# Patient Record
Sex: Female | Born: 1985 | Race: Black or African American | Hispanic: No | Marital: Single | Smoking: Never smoker
Health system: Southern US, Community
[De-identification: ages and names within clinical notes are randomized; demographics above are authoritative.]

## PROBLEM LIST (undated history)

## (undated) DIAGNOSIS — K219 Gastro-esophageal reflux disease without esophagitis: Secondary | ICD-10-CM

## (undated) DIAGNOSIS — I10 Essential (primary) hypertension: Secondary | ICD-10-CM

## (undated) DIAGNOSIS — T4145XA Adverse effect of unspecified anesthetic, initial encounter: Secondary | ICD-10-CM

## (undated) DIAGNOSIS — T8859XA Other complications of anesthesia, initial encounter: Secondary | ICD-10-CM

## (undated) HISTORY — DX: Essential (primary) hypertension: I10

---

## 2014-06-29 DIAGNOSIS — E663 Overweight: Secondary | ICD-10-CM | POA: Insufficient documentation

## 2014-09-21 DIAGNOSIS — K649 Unspecified hemorrhoids: Secondary | ICD-10-CM | POA: Insufficient documentation

## 2018-04-29 ENCOUNTER — Ambulatory Visit: Payer: Self-pay | Admitting: Surgery

## 2018-04-29 NOTE — H&P (Signed)
Subjective:  CC: Anal skin tag [K64.4]   HPI:  Karen Gaines is a 33 y.o. female who was referrred by Mayford Knife for above. Symptoms were first noted several years ago.  Pain is dull and intermittent, confined to the perianal area, without radiation.  Associated with nothing specific like bleeding, exacerbated by BMs.    Intermittent enlargements and bleeding but maintaining good hygiene is main concern.  Toilet habits: Daily, soft BMs, with minimal straining   Patient denies a personal history of colon cancer. Patient denies a personal history of IBD. Patient denies to history of polyps.  Never had colonoscopy   Past Medical History:  has a past medical history of GERD (gastroesophageal reflux disease).  Past Surgical History:  has a past surgical history that includes Cesarean section (10/03/2016).  Family History: reviewed and not relevant to CC  Social History:  reports that she has never smoked. She has never used smokeless tobacco. She reports previous alcohol use. She reports that she does not use drugs.  Current Medications: has a current medication list which includes the following prescription(s): acetaminophen and norethindrone.  Allergies:     Allergies as of 04/25/2018  . (No Known Allergies)    ROS:  A 15 point review of systems was performed and pertinent positives and negatives noted in HPI  Objective:   BP 121/76 (BP Location: Left upper arm, Patient Position: Sitting, BP Cuff Size: Adult)   Ht 172.7 cm (5\' 8" )   Wt 78 kg (172 lb)   LMP 04/09/2018 (Exact Date)   BMI 26.15 kg/m   Constitutional :  alert, appears stated age, cooperative and no distress  Lymphatics/Throat::  no asymmetry, masses, or scars  Respiratory:  clear to auscultation bilaterally  Cardiovascular:  regular rate and rhythm  Gastrointestinal: soft, non-tender; bowel sounds normal; no masses,  no organomegaly.    Musculoskeletal: Steady gait and movement  Skin: Cool and moist   Psychiatric: Normal affect, non-agitated, not confused  Genital/Rectal: Chaperone present for exam.  External exam noted to have several external skin tags circumfrentially, with one larger one that DRE revealed, normal rectal tone, with palpable internal hemorrhoid noted at RA portion with minimal discomfort.  After obtaining verbal consent, an anoscope was inserted after prepped with lubricant and internal hemorrhoids noted on DRE confirmed visually, with no evidence of thrombosis. No other pathology such as fissures, fistulas, polyps noted.  Scope withdrawn and patient tolerate procedure well.     LABS:   N/a  RADS: n/a Assessment:   Anal skin tag [K64.4]  Internal hemorrhoids grade I Plan:  1. Anal skin tag [K64.4] Discussed risks/benefits/alternatives to surgery.  Alternatives include the options of observation, medical management.  Benefits include symptomatic relief.  I discussed  in detail and the complications related to the operation and the anesthesia, including bleeding, infection, recurrence, remote possibility of temporary or permanent fecal incontinence, poor/delayed wound healing, chronic pain, and additional procedures to address said risks. The risks of general anesthetic, if used, includes MI, CVA, sudden death or even reaction to anesthetic medications also discussed.   We also discussed typical post operative recovery which includes weeks to potentially months of anal pain, drainage, occasional bleeding, and sense of fecal urgency.    ED return precautions given for sudden increase in pain, bleeding, with possible accompanying fever, nausea, and/or vomiting.  The patient understands the risks, any and all questions were answered to the patient's satisfaction.  2. Patient has elected to proceed with surgical removal of skin  tags and internal hemorrhoidectomy, stating she does not want to continue to need to wash the area with water every time she has a BM.  Procedure  will be scheduled.  Written consent was obtained.    Electronically signed by Rodarius Kichline, DO on 04/25/2018 10:04 AM      

## 2018-04-29 NOTE — H&P (View-Only) (Signed)
Subjective:  CC: Anal skin tag [K64.4]   HPI:  Karen Gaines is a 33 y.o. female who was referrred by Mayford Knife for above. Symptoms were first noted several years ago.  Pain is dull and intermittent, confined to the perianal area, without radiation.  Associated with nothing specific like bleeding, exacerbated by BMs.    Intermittent enlargements and bleeding but maintaining good hygiene is main concern.  Toilet habits: Daily, soft BMs, with minimal straining   Patient denies a personal history of colon cancer. Patient denies a personal history of IBD. Patient denies to history of polyps.  Never had colonoscopy   Past Medical History:  has a past medical history of GERD (gastroesophageal reflux disease).  Past Surgical History:  has a past surgical history that includes Cesarean section (10/03/2016).  Family History: reviewed and not relevant to CC  Social History:  reports that she has never smoked. She has never used smokeless tobacco. She reports previous alcohol use. She reports that she does not use drugs.  Current Medications: has a current medication list which includes the following prescription(s): acetaminophen and norethindrone.  Allergies:     Allergies as of 04/25/2018  . (No Known Allergies)    ROS:  A 15 point review of systems was performed and pertinent positives and negatives noted in HPI  Objective:   BP 121/76 (BP Location: Left upper arm, Patient Position: Sitting, BP Cuff Size: Adult)   Ht 172.7 cm (5\' 8" )   Wt 78 kg (172 lb)   LMP 04/09/2018 (Exact Date)   BMI 26.15 kg/m   Constitutional :  alert, appears stated age, cooperative and no distress  Lymphatics/Throat::  no asymmetry, masses, or scars  Respiratory:  clear to auscultation bilaterally  Cardiovascular:  regular rate and rhythm  Gastrointestinal: soft, non-tender; bowel sounds normal; no masses,  no organomegaly.    Musculoskeletal: Steady gait and movement  Skin: Cool and moist   Psychiatric: Normal affect, non-agitated, not confused  Genital/Rectal: Chaperone present for exam.  External exam noted to have several external skin tags circumfrentially, with one larger one that DRE revealed, normal rectal tone, with palpable internal hemorrhoid noted at RA portion with minimal discomfort.  After obtaining verbal consent, an anoscope was inserted after prepped with lubricant and internal hemorrhoids noted on DRE confirmed visually, with no evidence of thrombosis. No other pathology such as fissures, fistulas, polyps noted.  Scope withdrawn and patient tolerate procedure well.     LABS:   N/a  RADS: n/a Assessment:   Anal skin tag [K64.4]  Internal hemorrhoids grade I Plan:  1. Anal skin tag [K64.4] Discussed risks/benefits/alternatives to surgery.  Alternatives include the options of observation, medical management.  Benefits include symptomatic relief.  I discussed  in detail and the complications related to the operation and the anesthesia, including bleeding, infection, recurrence, remote possibility of temporary or permanent fecal incontinence, poor/delayed wound healing, chronic pain, and additional procedures to address said risks. The risks of general anesthetic, if used, includes MI, CVA, sudden death or even reaction to anesthetic medications also discussed.   We also discussed typical post operative recovery which includes weeks to potentially months of anal pain, drainage, occasional bleeding, and sense of fecal urgency.    ED return precautions given for sudden increase in pain, bleeding, with possible accompanying fever, nausea, and/or vomiting.  The patient understands the risks, any and all questions were answered to the patient's satisfaction.  2. Patient has elected to proceed with surgical removal of skin  tags and internal hemorrhoidectomy, stating she does not want to continue to need to wash the area with water every time she has a BM.  Procedure  will be scheduled.  Written consent was obtained.    Electronically signed by Sung Amabile, DO on 04/25/2018 10:04 AM

## 2018-05-01 ENCOUNTER — Other Ambulatory Visit: Payer: Self-pay

## 2018-05-01 ENCOUNTER — Encounter
Admission: RE | Admit: 2018-05-01 | Discharge: 2018-05-01 | Disposition: A | Discharge status: Home / Self Care | Payer: Medicaid Other | Source: Ambulatory Visit | Attending: Surgery | Admitting: Surgery

## 2018-05-01 HISTORY — DX: Adverse effect of unspecified anesthetic, initial encounter: T41.45XA

## 2018-05-01 HISTORY — DX: Other complications of anesthesia, initial encounter: T88.59XA

## 2018-05-01 HISTORY — DX: Gastro-esophageal reflux disease without esophagitis: K21.9

## 2018-05-01 NOTE — Patient Instructions (Signed)
Your procedure is scheduled on: 05-09-18 Report to Same Day Surgery 2nd floor medical mall [facility] Entrance-take elevator on left to 2nd floor.  Check in with surgery information desk.) To find out your arrival time please call [phone] between 1PM - 3PM on 05-08-18  Remember: Instructions that are not followed completely may result in serious medical risk, up to and including death, or upon the discretion of your surgeon and anesthesiologist your surgery may need to be rescheduled.    _x___ 1. Do not eat food after midnight the night before your procedure. You may drink clear liquids up to 2 hours before you are scheduled to arrive at the hospital for your procedure.  Do not drink clear liquids within 2 hours of your scheduled arrival to the hospital.  Clear liquids include  --Water or Apple juice without pulp  --Clear carbohydrate beverage such as ClearFast or Gatorade  --Black Coffee or Clear Tea (No milk, no creamers, do not add anything to the coffee or Tea   ____Ensure clear carbohydrate drink on the way to the hospital for bariatric patients  ____Ensure clear carbohydrate drink 3 hours before surgery for Dr Rutherford Nail patients if physician instructed.   No gum chewing or hard candies.     __x__ 2. No Alcohol for 24 hours before or after surgery.   __x__3. No Smoking or e-cigarettes for 24 prior to surgery.  Do not use any chewable tobacco products for at least 6 hour prior to surgery   ____  4. Bring all medications with you on the day of surgery if instructed.    __x__ 5. Notify your doctor if there is any change in your medical condition     (cold, fever, infections).    x___6. On the morning of surgery brush your teeth with toothpaste and water.  You may rinse your mouth with mouth wash if you wish.  Do not swallow any toothpaste or mouthwash.   Do not wear jewelry, make-up, hairpins, clips or nail polish.  Do not wear lotions, powders, or perfumes. You may wear  deodorant.  Do not shave 48 hours prior to surgery. Men may shave face and neck.  Do not bring valuables to the hospital.    [facility] is not responsible for any belongings or valuables.               Contacts, dentures or bridgework may not be worn into surgery.  Leave your suitcase in the car. After surgery it may be brought to your room.  For patients admitted to the hospital, discharge time is determined by your  treatment team.  _  Patients discharged the day of surgery will not be allowed to drive home.  You will need someone to drive you home and stay with you the night of your procedure.    Please read over the following fact sheets that you were given:   [facility] Preparing for Surgery   ____ Take anti-hypertensive listed below, cardiac, seizure, asthma, anti-reflux and psychiatric medicines. These include:  1. NONE  2.  3.  4.  5.  6.  ____Fleets enema or Magnesium Citrate as directed.   ____ Use CHG Soap or sage wipes as directed on instruction sheet   ____ Use inhalers on the day of surgery and bring to hospital day of surgery  ____ Stop Metformin and Janumet 2 days prior to surgery.    ____ Take 1/2 of usual insulin dose the night before surgery and none  on the morning     surgery.   ____ Follow recommendations from Cardiologist, Pulmonologist or PCP regarding          stopping Aspirin, Coumadin, Plavix ,Eliquis, Effient, or Pradaxa, and Pletal.  X____Stop Anti-inflammatories such as Advil, Aleve, Ibuprofen, Motrin, Naproxen, Naprosyn, Goodies powders or aspirin products NOW-OK to take Tylenol   ____ Stop supplements until after surgery.  But may continue Vitamin D, Vitamin B,       and multivitamin.   ____ Bring C-Pap to the hospital.

## 2018-05-09 ENCOUNTER — Encounter: Payer: Self-pay | Admitting: *Deleted

## 2018-05-09 ENCOUNTER — Ambulatory Visit: Payer: Medicaid Other | Admitting: Registered Nurse

## 2018-05-09 ENCOUNTER — Other Ambulatory Visit: Payer: Self-pay

## 2018-05-09 ENCOUNTER — Encounter
Admission: RE | Disposition: A | Discharge status: Home / Self Care | Payer: Self-pay | Source: Home / Self Care | Attending: Surgery

## 2018-05-09 ENCOUNTER — Ambulatory Visit
Admission: RE | Admit: 2018-05-09 | Discharge: 2018-05-09 | Disposition: A | Discharge status: Home / Self Care | Payer: Medicaid Other | Attending: Surgery | Admitting: Surgery

## 2018-05-09 DIAGNOSIS — K644 Residual hemorrhoidal skin tags: Secondary | ICD-10-CM | POA: Insufficient documentation

## 2018-05-09 DIAGNOSIS — K641 Second degree hemorrhoids: Secondary | ICD-10-CM

## 2018-05-09 DIAGNOSIS — L918 Other hypertrophic disorders of the skin: Secondary | ICD-10-CM | POA: Insufficient documentation

## 2018-05-09 DIAGNOSIS — K219 Gastro-esophageal reflux disease without esophagitis: Secondary | ICD-10-CM | POA: Insufficient documentation

## 2018-05-09 DIAGNOSIS — Z79899 Other long term (current) drug therapy: Secondary | ICD-10-CM | POA: Insufficient documentation

## 2018-05-09 HISTORY — PX: HEMORRHOID SURGERY: SHX153

## 2018-05-09 LAB — POCT PREGNANCY, URINE: Preg Test, Ur: NEGATIVE

## 2018-05-09 SURGERY — HEMORRHOIDECTOMY
Anesthesia: General | Site: Rectum

## 2018-05-09 MED ORDER — GABAPENTIN 300 MG PO CAPS
300.0000 mg | ORAL_CAPSULE | ORAL | Status: AC
  Administered 2018-05-09: 300 mg via ORAL

## 2018-05-09 MED ORDER — ACETAMINOPHEN 325 MG PO TABS: 650.0000 mg | ORAL_TABLET | Freq: Three times a day (TID) | ORAL | 0 refills | Status: AC | PRN

## 2018-05-09 MED ORDER — MIDAZOLAM HCL 2 MG/2ML IJ SOLN
INTRAMUSCULAR | Status: AC
  Filled 2018-05-09: qty 2

## 2018-05-09 MED ORDER — FAMOTIDINE 20 MG PO TABS
ORAL_TABLET | ORAL | Status: AC
  Administered 2018-05-09: 20 mg via ORAL
  Filled 2018-05-09: qty 1

## 2018-05-09 MED ORDER — DEXAMETHASONE SODIUM PHOSPHATE 10 MG/ML IJ SOLN
INTRAMUSCULAR | Status: DC | PRN
  Administered 2018-05-09: 5 mg via INTRAVENOUS

## 2018-05-09 MED ORDER — OXYCODONE HCL 5 MG/5ML PO SOLN: 5.0000 mg | Freq: Once | ORAL | Status: DC | PRN

## 2018-05-09 MED ORDER — ONDANSETRON HCL 4 MG/2ML IJ SOLN
INTRAMUSCULAR | Status: DC | PRN
  Administered 2018-05-09: 4 mg via INTRAVENOUS

## 2018-05-09 MED ORDER — PROPOFOL 500 MG/50ML IV EMUL
INTRAVENOUS | Status: AC
  Filled 2018-05-09: qty 50

## 2018-05-09 MED ORDER — ACETAMINOPHEN 500 MG PO TABS
ORAL_TABLET | ORAL | Status: AC
  Administered 2018-05-09: 1000 mg via ORAL
  Filled 2018-05-09: qty 2

## 2018-05-09 MED ORDER — BUPIVACAINE-EPINEPHRINE (PF) 0.5% -1:200000 IJ SOLN
INTRAMUSCULAR | Status: DC | PRN
  Administered 2018-05-09: 6 mL via PERINEURAL

## 2018-05-09 MED ORDER — LIDOCAINE HCL (CARDIAC) PF 100 MG/5ML IV SOSY
PREFILLED_SYRINGE | INTRAVENOUS | Status: DC | PRN
  Administered 2018-05-09: 80 mg via INTRAVENOUS

## 2018-05-09 MED ORDER — DOCUSATE SODIUM 100 MG PO CAPS: 100.0000 mg | ORAL_CAPSULE | Freq: Two times a day (BID) | ORAL | 0 refills | Status: AC | PRN

## 2018-05-09 MED ORDER — ONDANSETRON HCL 4 MG/2ML IJ SOLN
INTRAMUSCULAR | Status: AC
  Filled 2018-05-09: qty 2

## 2018-05-09 MED ORDER — IBUPROFEN 800 MG PO TABS: 800.0000 mg | ORAL_TABLET | Freq: Three times a day (TID) | ORAL | 0 refills | Status: DC | PRN

## 2018-05-09 MED ORDER — LACTATED RINGERS IV SOLN
INTRAVENOUS | Status: DC
  Administered 2018-05-09: 12:00:00 via INTRAVENOUS

## 2018-05-09 MED ORDER — HYDROCODONE-ACETAMINOPHEN 5-325 MG PO TABS: 1.0000 | ORAL_TABLET | Freq: Four times a day (QID) | ORAL | 0 refills | Status: AC | PRN

## 2018-05-09 MED ORDER — OXYCODONE HCL 5 MG PO TABS: 5.0000 mg | ORAL_TABLET | Freq: Once | ORAL | Status: DC | PRN

## 2018-05-09 MED ORDER — EPHEDRINE SULFATE 50 MG/ML IJ SOLN
INTRAMUSCULAR | Status: AC
  Filled 2018-05-09: qty 1

## 2018-05-09 MED ORDER — MEPERIDINE HCL 50 MG/ML IJ SOLN: 6.2500 mg | INTRAMUSCULAR | Status: DC | PRN

## 2018-05-09 MED ORDER — PROPOFOL 10 MG/ML IV BOLUS
INTRAVENOUS | Status: AC
  Filled 2018-05-09: qty 20

## 2018-05-09 MED ORDER — FENTANYL CITRATE (PF) 100 MCG/2ML IJ SOLN
INTRAMUSCULAR | Status: DC | PRN
  Administered 2018-05-09 (×4): 25 ug via INTRAVENOUS

## 2018-05-09 MED ORDER — GELATIN ABSORBABLE 12-7 MM EX MISC
CUTANEOUS | Status: AC
  Filled 2018-05-09: qty 1

## 2018-05-09 MED ORDER — BUPIVACAINE LIPOSOME 1.3 % IJ SUSP
INTRAMUSCULAR | Status: DC | PRN
  Administered 2018-05-09: 20 mL

## 2018-05-09 MED ORDER — DEXAMETHASONE SODIUM PHOSPHATE 10 MG/ML IJ SOLN
INTRAMUSCULAR | Status: AC
  Filled 2018-05-09: qty 1

## 2018-05-09 MED ORDER — KETOROLAC TROMETHAMINE 30 MG/ML IJ SOLN
INTRAMUSCULAR | Status: DC | PRN
  Administered 2018-05-09: 30 mg via INTRAVENOUS

## 2018-05-09 MED ORDER — KETOROLAC TROMETHAMINE 30 MG/ML IJ SOLN
INTRAMUSCULAR | Status: AC
  Filled 2018-05-09: qty 1

## 2018-05-09 MED ORDER — CELECOXIB 200 MG PO CAPS
200.0000 mg | ORAL_CAPSULE | ORAL | Status: AC
  Administered 2018-05-09: 200 mg via ORAL

## 2018-05-09 MED ORDER — FENTANYL CITRATE (PF) 100 MCG/2ML IJ SOLN
INTRAMUSCULAR | Status: AC
  Filled 2018-05-09: qty 2

## 2018-05-09 MED ORDER — EPHEDRINE SULFATE 50 MG/ML IJ SOLN
INTRAMUSCULAR | Status: DC | PRN
  Administered 2018-05-09 (×3): 5 mg via INTRAVENOUS

## 2018-05-09 MED ORDER — MIDAZOLAM HCL 2 MG/2ML IJ SOLN
INTRAMUSCULAR | Status: DC | PRN
  Administered 2018-05-09: 2 mg via INTRAVENOUS

## 2018-05-09 MED ORDER — LIDOCAINE HCL (PF) 2 % IJ SOLN
INTRAMUSCULAR | Status: AC
  Filled 2018-05-09: qty 10

## 2018-05-09 MED ORDER — PROMETHAZINE HCL 25 MG/ML IJ SOLN: 6.2500 mg | INTRAMUSCULAR | Status: DC | PRN

## 2018-05-09 MED ORDER — CELECOXIB 200 MG PO CAPS
ORAL_CAPSULE | ORAL | Status: AC
  Administered 2018-05-09: 200 mg via ORAL
  Filled 2018-05-09: qty 1

## 2018-05-09 MED ORDER — PHENYLEPHRINE HCL 10 MG/ML IJ SOLN
INTRAMUSCULAR | Status: AC
  Filled 2018-05-09: qty 1

## 2018-05-09 MED ORDER — GABAPENTIN 300 MG PO CAPS
ORAL_CAPSULE | ORAL | Status: AC
  Administered 2018-05-09: 300 mg via ORAL
  Filled 2018-05-09: qty 1

## 2018-05-09 MED ORDER — CHLORHEXIDINE GLUCONATE CLOTH 2 % EX PADS: 6.0000 | MEDICATED_PAD | Freq: Once | CUTANEOUS | Status: DC

## 2018-05-09 MED ORDER — ACETAMINOPHEN 500 MG PO TABS
1000.0000 mg | ORAL_TABLET | ORAL | Status: AC
  Administered 2018-05-09: 1000 mg via ORAL

## 2018-05-09 MED ORDER — FAMOTIDINE 20 MG PO TABS
20.0000 mg | ORAL_TABLET | Freq: Once | ORAL | Status: AC
  Administered 2018-05-09: 20 mg via ORAL

## 2018-05-09 MED ORDER — PROPOFOL 10 MG/ML IV BOLUS
INTRAVENOUS | Status: DC | PRN
  Administered 2018-05-09: 100 mg via INTRAVENOUS

## 2018-05-09 MED ORDER — FENTANYL CITRATE (PF) 100 MCG/2ML IJ SOLN: 25.0000 ug | INTRAMUSCULAR | Status: DC | PRN

## 2018-05-09 MED ORDER — PHENYLEPHRINE HCL 10 MG/ML IJ SOLN
INTRAMUSCULAR | Status: DC | PRN
  Administered 2018-05-09: 100 ug via INTRAVENOUS
  Administered 2018-05-09: 50 ug via INTRAVENOUS
  Administered 2018-05-09: 100 ug via INTRAVENOUS

## 2018-05-09 SURGICAL SUPPLY — 29 items
BLADE SURG 15 STRL LF DISP TIS (BLADE) ×1 IMPLANT
BLADE SURG 15 STRL SS (BLADE) ×1
BRIEF STRETCH MATERNITY 2XLG (MISCELLANEOUS) ×2 IMPLANT
CANISTER SUCT 1200ML W/VALVE (MISCELLANEOUS) ×2 IMPLANT
COVER WAND RF STERILE (DRAPES) ×2 IMPLANT
DRAPE PERI LITHO V/GYN (MISCELLANEOUS) ×2 IMPLANT
DRAPE UNDER BUTTOCK W/FLU (DRAPES) ×2 IMPLANT
DRSG GAUZE FLUFF 36X18 (GAUZE/BANDAGES/DRESSINGS) ×2 IMPLANT
ELECT REM PT RETURN 9FT ADLT (ELECTROSURGICAL) ×2
ELECTRODE REM PT RTRN 9FT ADLT (ELECTROSURGICAL) ×1 IMPLANT
GLOVE BIOGEL PI IND STRL 7.0 (GLOVE) ×1 IMPLANT
GLOVE BIOGEL PI INDICATOR 7.0 (GLOVE) ×1
GLOVE SURG SYN 6.5 ES PF (GLOVE) ×2 IMPLANT
GOWN STRL REUS W/ TWL LRG LVL3 (GOWN DISPOSABLE) ×2 IMPLANT
GOWN STRL REUS W/TWL LRG LVL3 (GOWN DISPOSABLE) ×2
KIT TURNOVER CYSTO (KITS) ×2 IMPLANT
LABEL OR SOLS (LABEL) ×2 IMPLANT
NEEDLE HYPO 22GX1.5 SAFETY (NEEDLE) ×2 IMPLANT
NEEDLE HYPO 25X1 1.5 SAFETY (NEEDLE) ×2 IMPLANT
NS IRRIG 500ML POUR BTL (IV SOLUTION) ×2 IMPLANT
PACK BASIN MINOR ARMC (MISCELLANEOUS) ×2 IMPLANT
PAD PREP 24X41 OB/GYN DISP (PERSONAL CARE ITEMS) ×2 IMPLANT
SCRUB POVIDONE IODINE 4 OZ (MISCELLANEOUS) ×2 IMPLANT
SPONGE SURGIFOAM ABS GEL 12-7 (HEMOSTASIS) ×2 IMPLANT
SURGILUBE 2OZ TUBE FLIPTOP (MISCELLANEOUS) ×2 IMPLANT
SUT VIC AB 3-0 SH 27 (SUTURE) ×1
SUT VIC AB 3-0 SH 27X BRD (SUTURE) ×1 IMPLANT
SYR 10ML LL (SYRINGE) ×2 IMPLANT
SYR 20CC LL (SYRINGE) ×2 IMPLANT

## 2018-05-09 NOTE — Transfer of Care (Signed)
Immediate Anesthesia Transfer of Care Note  Patient: Karen Gaines  Procedure(s) Performed: HEMORRHOIDECTOMY AND SKIN TAG REMOVAL (N/A Rectum)  Patient Location: PACU  Anesthesia Type:General  Level of Consciousness: sedated  Airway & Oxygen Therapy: Patient Spontanous Breathing and Patient connected to face mask oxygen  Post-op Assessment: Report given to RN and Post -op Vital signs reviewed and stable  Post vital signs: Reviewed and stable  Last Vitals:  Vitals Value Taken Time  BP 115/67 05/09/2018  1:16 PM  Temp 36.3 C 05/09/2018  1:16 PM  Pulse 79 05/09/2018  1:16 PM  Resp 12 05/09/2018  1:16 PM  SpO2 100 % 05/09/2018  1:16 PM  Vitals shown include unvalidated device data.  Last Pain:  Vitals:   05/09/18 1316  TempSrc:   PainSc: 0-No pain         Complications: No apparent anesthesia complications

## 2018-05-09 NOTE — Interval H&P Note (Signed)
History and Physical Interval Note:  05/09/2018 11:32 AM  Karen Gaines  has presented today for surgery, with the diagnosis of SKIN TAG REMOVAL AND HEMORRHOID  The various methods of treatment have been discussed with the patient and family. After consideration of risks, benefits and other options for treatment, the patient has consented to  Procedure(s): HEMORRHOIDECTOMY AND SKIN TAG REMOVAL (N/A) as a surgical intervention .  The patient's history has been reviewed, patient examined, no change in status, stable for surgery.  I have reviewed the patient's chart and labs.  Questions were answered to the patient's satisfaction.     Scherrie Seneca Tonna Boehringer

## 2018-05-09 NOTE — Op Note (Signed)
Preoperative diagnosis: second degree hemorrhoids, anal skin tags   Postoperative diagnosis: same  Procedure: exam under anesthesia, hemorrhoidectomy, skin tag removal  Surgeon: Tonna Boehringer  Anesthesia: general  Specimen: hemorrhoids x2  Complications: none  EBL: 64mL  Wound classification: Clean Contaminated  Indications: Patient is a 33 y.o. female was found to have symptomatic hemorrhoids refractory to medical management.   Findings: 1. Second degree hemorrhoids 2. Internal and external anal sphincter palpated and preserved 3. Adequate hemostasis  Description of procedure: The patient was brought to the operating room and general anesthesia was induced. Patient was placed high lithotomy position. A time-out was completed verifying correct patient, procedure, site, positioning, and implant(s) and/or special equipment prior to beginning this procedure. The perineum was prepped and draped in standard sterile fashion. Local anesthetic was injected as a perianal block. An anoscope was introduced and two anterior and posterior hemorrhoidal pedicles with associated skin tags were identified.   A Kelly clamp was placed across the base of the posterior pedicle near the dentate line and retracted externally to exteriorize the hemorrhoidal pedicles.  3-0 vicryl was placed at the most interior aspect of the hemorrhoid base.  The excess hemorrhoid tissue then removed using 15 blade and passed off operative field pending pathology.  The clamp was removed and the previously placed 3-0 vicryl was used to close the open base in a running fashion, ensuring no sphincter muscle was sutured into the wound.     A Kelly clamp was placed across the base of the anterior pedicle near the dentate line and retracted externally to exteriorize the hemorrhoidal pedicles.  3-0 vicryl was placed at the most interior aspect of the hemorrhoid base.  The excess hemorrhoid tissue then removed using 15 blade and passed off  operative field pending pathology.  The clamp was removed and the previously placed 3-0 vicryl was used to close the open base in a running fashion, ensuring no sphincter muscle was sutured into the wound.   Hemostasis achieved with additional 3-0 vicryl suture in areas of bleeding until all active bleeding controlled.  Last inspection of the anal canal did not note any additional hemorrhoidal tissue and no other pathology. Gelfoam placed within rectum, and exparel injected as a perianal block. A gauze pad was tucked between the gluteal folds, and secured in place with mesh underwear.  The patient tolerated the procedure well and was taken to the postanesthesia care unit in stable condition.

## 2018-05-09 NOTE — Anesthesia Procedure Notes (Signed)
Procedure Name: LMA Insertion Date/Time: 05/09/2018 12:16 PM Performed by: Karoline Caldwell, CRNA Pre-anesthesia Checklist: Patient identified, Patient being monitored, Timeout performed, Emergency Drugs available and Suction available Patient Re-evaluated:Patient Re-evaluated prior to induction Oxygen Delivery Method: Circle system utilized Preoxygenation: Pre-oxygenation with 100% oxygen Induction Type: IV induction Ventilation: Mask ventilation without difficulty LMA: LMA inserted LMA Size: 3.5 Number of attempts: 1 Placement Confirmation: positive ETCO2 and breath sounds checked- equal and bilateral Tube secured with: Tape Dental Injury: Teeth and Oropharynx as per pre-operative assessment

## 2018-05-09 NOTE — Anesthesia Post-op Follow-up Note (Signed)
Anesthesia QCDR form completed.        

## 2018-05-09 NOTE — Anesthesia Preprocedure Evaluation (Signed)
Anesthesia Evaluation  Patient identified by MRN, date of birth, ID band Patient awake    Reviewed: Allergy & Precautions, NPO status , Patient's Chart, lab work & pertinent test results  History of Anesthesia Complications Negative for: history of anesthetic complications  Airway Mallampati: II  TM Distance: >3 FB Neck ROM: Full    Dental no notable dental hx.    Pulmonary neg pulmonary ROS, neg sleep apnea, neg COPD,    breath sounds clear to auscultation- rhonchi (-) wheezing      Cardiovascular Exercise Tolerance: Good (-) hypertension(-) CAD, (-) Past MI, (-) Cardiac Stents and (-) CABG  Rhythm:Regular Rate:Normal - Systolic murmurs and - Diastolic murmurs    Neuro/Psych neg Seizures negative neurological ROS  negative psych ROS   GI/Hepatic Neg liver ROS, GERD  ,  Endo/Other  negative endocrine ROSneg diabetes  Renal/GU negative Renal ROS     Musculoskeletal negative musculoskeletal ROS (+)   Abdominal (+) - obese,   Peds  Hematology negative hematology ROS (+)   Anesthesia Other Findings Past Medical History: No date: Complication of anesthesia     Comment:  HAD EMERGENCY SECTION AND SAID SHE WAS NOT NUMB AT ALL               AND FELT EVERYTHING No date: GERD (gastroesophageal reflux disease)     Comment:  RARE-NO MEDS   Reproductive/Obstetrics                             Anesthesia Physical Anesthesia Plan  ASA: II  Anesthesia Plan: General   Post-op Pain Management:    Induction: Intravenous  PONV Risk Score and Plan: 2 and Ondansetron, Dexamethasone and Midazolam  Airway Management Planned: LMA  Additional Equipment:   Intra-op Plan:   Post-operative Plan:   Informed Consent: I have reviewed the patients History and Physical, chart, labs and discussed the procedure including the risks, benefits and alternatives for the proposed anesthesia with the patient or  authorized representative who has indicated his/her understanding and acceptance.     Dental advisory given  Plan Discussed with: CRNA and Anesthesiologist  Anesthesia Plan Comments:         Anesthesia Quick Evaluation

## 2018-05-09 NOTE — Discharge Instructions (Signed)

## 2018-05-12 LAB — SURGICAL PATHOLOGY

## 2018-05-12 NOTE — Anesthesia Postprocedure Evaluation (Signed)
Anesthesia Post Note  Patient: Karen Gaines  Procedure(s) Performed: HEMORRHOIDECTOMY AND SKIN TAG REMOVAL (N/A Rectum)  Patient location during evaluation: PACU Anesthesia Type: General Level of consciousness: awake and alert Pain management: pain level controlled Vital Signs Assessment: post-procedure vital signs reviewed and stable Respiratory status: spontaneous breathing, nonlabored ventilation, respiratory function stable and patient connected to nasal cannula oxygen Cardiovascular status: blood pressure returned to baseline and stable Postop Assessment: no apparent nausea or vomiting Anesthetic complications: no     Last Vitals:  Vitals:   05/09/18 1415 05/09/18 1446  BP: 136/87 134/80  Pulse: 73 71  Resp: 16 16  Temp: (!) 36.3 C   SpO2: 99% 100%    Last Pain:  Vitals:   05/12/18 0903  TempSrc:   PainSc: 6                  Lenard Simmer

## 2018-11-24 ENCOUNTER — Encounter (HOSPITAL_COMMUNITY): Payer: Self-pay | Admitting: Emergency Medicine

## 2018-11-24 ENCOUNTER — Emergency Department (HOSPITAL_COMMUNITY)
Admission: EM | Admit: 2018-11-24 | Discharge: 2018-11-24 | Disposition: A | Discharge status: Home / Self Care | Payer: Medicaid Other | Attending: Emergency Medicine | Admitting: Emergency Medicine

## 2018-11-24 ENCOUNTER — Emergency Department (HOSPITAL_COMMUNITY): Payer: Medicaid Other

## 2018-11-24 DIAGNOSIS — Z87440 Personal history of urinary (tract) infections: Secondary | ICD-10-CM | POA: Diagnosis not present

## 2018-11-24 DIAGNOSIS — R110 Nausea: Secondary | ICD-10-CM | POA: Diagnosis not present

## 2018-11-24 DIAGNOSIS — R1031 Right lower quadrant pain: Secondary | ICD-10-CM

## 2018-11-24 DIAGNOSIS — R109 Unspecified abdominal pain: Secondary | ICD-10-CM

## 2018-11-24 DIAGNOSIS — N898 Other specified noninflammatory disorders of vagina: Secondary | ICD-10-CM | POA: Insufficient documentation

## 2018-11-24 DIAGNOSIS — R300 Dysuria: Secondary | ICD-10-CM | POA: Insufficient documentation

## 2018-11-24 LAB — CBC WITH DIFFERENTIAL/PLATELET
Abs Immature Granulocytes: 0.05 10*3/uL (ref 0.00–0.07)
Basophils Absolute: 0 10*3/uL (ref 0.0–0.1)
Basophils Relative: 0 %
Eosinophils Absolute: 0 10*3/uL (ref 0.0–0.5)
Eosinophils Relative: 0 %
HCT: 38.8 % (ref 36.0–46.0)
Hemoglobin: 12.9 g/dL (ref 12.0–15.0)
Immature Granulocytes: 1 %
Lymphocytes Relative: 18 %
Lymphs Abs: 1.3 10*3/uL (ref 0.7–4.0)
MCH: 30.9 pg (ref 26.0–34.0)
MCHC: 33.2 g/dL (ref 30.0–36.0)
MCV: 92.8 fL (ref 80.0–100.0)
Monocytes Absolute: 0.5 10*3/uL (ref 0.1–1.0)
Monocytes Relative: 7 %
Neutro Abs: 5.3 10*3/uL (ref 1.7–7.7)
Neutrophils Relative %: 74 %
Platelets: 304 10*3/uL (ref 150–400)
RBC: 4.18 MIL/uL (ref 3.87–5.11)
RDW: 12.8 % (ref 11.5–15.5)
WBC: 7.2 10*3/uL (ref 4.0–10.5)
nRBC: 0 % (ref 0.0–0.2)

## 2018-11-24 LAB — URINALYSIS, ROUTINE W REFLEX MICROSCOPIC
Bilirubin Urine: NEGATIVE
Glucose, UA: NEGATIVE mg/dL
Hgb urine dipstick: NEGATIVE
Ketones, ur: NEGATIVE mg/dL
Leukocytes,Ua: NEGATIVE
Nitrite: NEGATIVE
Protein, ur: NEGATIVE mg/dL
Specific Gravity, Urine: 1.003 — ABNORMAL LOW (ref 1.005–1.030)
pH: 7 (ref 5.0–8.0)

## 2018-11-24 LAB — WET PREP, GENITAL
Clue Cells Wet Prep HPF POC: NONE SEEN
Sperm: NONE SEEN
Trich, Wet Prep: NONE SEEN
WBC, Wet Prep HPF POC: NONE SEEN
Yeast Wet Prep HPF POC: NONE SEEN

## 2018-11-24 LAB — I-STAT BETA HCG BLOOD, ED (MC, WL, AP ONLY): I-stat hCG, quantitative: <5 m[IU]/mL (ref <5)

## 2018-11-24 LAB — COMPREHENSIVE METABOLIC PANEL
ALT: 11 U/L (ref 0–44)
AST: 13 U/L — ABNORMAL LOW (ref 15–41)
Albumin: 3.8 g/dL (ref 3.5–5.0)
Alkaline Phosphatase: 60 U/L (ref 38–126)
Anion gap: 7 (ref 5–15)
BUN: 7 mg/dL (ref 6–20)
CO2: 28 mmol/L (ref 22–32)
Calcium: 9.1 mg/dL (ref 8.9–10.3)
Chloride: 104 mmol/L (ref 98–111)
Creatinine, Ser: 0.67 mg/dL (ref 0.44–1.00)
GFR calc Af Amer: >60 mL/min (ref >60)
GFR calc non Af Amer: >60 mL/min (ref >60)
Glucose, Bld: 139 mg/dL — ABNORMAL HIGH (ref 70–99)
Potassium: 3.8 mmol/L (ref 3.5–5.1)
Sodium: 139 mmol/L (ref 135–145)
Total Bilirubin: 0.3 mg/dL (ref 0.3–1.2)
Total Protein: 6.6 g/dL (ref 6.5–8.1)

## 2018-11-24 LAB — LIPASE, BLOOD: Lipase: 29 U/L (ref 11–51)

## 2018-11-24 MED ORDER — KETOROLAC TROMETHAMINE 15 MG/ML IJ SOLN
15.0000 mg | Freq: Once | INTRAMUSCULAR | Status: AC
  Administered 2018-11-24: 15 mg via INTRAVENOUS
  Filled 2018-11-24: qty 1

## 2018-11-24 MED ORDER — IOHEXOL 300 MG/ML  SOLN
100.0000 mL | Freq: Once | INTRAMUSCULAR | Status: AC | PRN
  Administered 2018-11-24: 100 mL via INTRAVENOUS

## 2018-11-24 MED ORDER — ACETAMINOPHEN 500 MG PO TABS
1000.0000 mg | ORAL_TABLET | Freq: Once | ORAL | Status: AC
  Administered 2018-11-24: 1000 mg via ORAL
  Filled 2018-11-24: qty 2

## 2018-11-24 NOTE — ED Provider Notes (Signed)
[facility] Provider Note   CSN: [id number] Arrival date & time: 11/24/18  1324     History   Chief Complaint No chief complaint on file.   HPI Karen Gaines is a 33 y.o. female with a past medical history of hemorrhoids and recurrent UTIs who presents emergency department with 2 days of dysuria and right-sided flank pain.  Patient reports over the last 2 days she has had dysuria, increased urinary frequency, and some right-sided abdominal pain that radiates to her back.  Patient reports improvement in her symptoms with Tylenol.  Patient reports the pain is a burning and stabbing feeling that is constant.  Patient has not noticed anything makes the pain worse.  Patient denies any fevers or gross hematuria.  Patient denies history of nephrolithiasis.  Patient reports she is had some recurrent urinary tract infections last several months and most recently completed an antibiotic course 1 week ago.  Patient denies any vaginal discharge or bleeding.  Patient reports her last LMP was on 8/30.  Patient reports she went to urgent care today for the symptoms and was told she had hematuria and advised to come to the emergency department for further evaluation.  Patient reports some nausea without vomiting and normal bowel movements most recently today.    The history is provided by the patient.    Past Medical History:  Diagnosis Date  . Complication of anesthesia    HAD EMERGENCY SECTION AND SAID SHE WAS NOT NUMB AT ALL AND FELT EVERYTHING  . GERD (gastroesophageal reflux disease)    RARE-NO MEDS    There are no active problems to display for this patient.   Past Surgical History:  Procedure Laterality Date  . CESAREAN SECTION     X 2  . HEMORRHOID SURGERY N/A 05/09/2018   Procedure: HEMORRHOIDECTOMY AND SKIN TAG REMOVAL;  Surgeon: Sung AmabileSakai, Isami, DO;  Location: [facility] ORS;  Service: General;  Laterality: N/A;     OB History   No obstetric history on  file.      Home Medications    Prior to Admission medications   Not on File    Family History No family history on file.  Social History Social History   Tobacco Use  . Smoking status: Never Smoker  . Smokeless tobacco: Never Used  Substance Use Topics  . Alcohol use: Never    Frequency: Never  . Drug use: Never     Allergies   Patient has no known allergies.   Review of Systems Review of Systems  Constitutional: Negative for fever.  HENT: Negative for congestion and trouble swallowing.   Eyes: Negative for visual disturbance.  Respiratory: Negative for cough and shortness of breath.   Gastrointestinal: Positive for abdominal pain and nausea. Negative for constipation, diarrhea and vomiting.  Genitourinary: Positive for dysuria, flank pain and urgency. Negative for decreased urine volume, pelvic pain, vaginal bleeding and vaginal discharge.  Musculoskeletal: Positive for back pain.  Skin: Negative for rash.  Neurological: Negative for light-headedness and headaches.  Psychiatric/Behavioral: Negative for confusion.     Physical Exam Updated Vital Signs BP 112/78 (BP Location: Right Arm)   Pulse 68   Temp 98.4 F (36.9 C) (Oral)   Resp 16   SpO2 100%   Physical Exam Exam conducted with a chaperone present.  Constitutional:      General: She is not in acute distress.    Appearance: She is well-developed.  HENT:     Head: Normocephalic  and atraumatic.     Right Ear: External ear normal.     Left Ear: External ear normal.     Nose: Nose normal.     Mouth/Throat:     Mouth: Mucous membranes are moist.     Pharynx: Oropharynx is clear.  Eyes:     Pupils: Pupils are equal, round, and reactive to light.  Neck:     Musculoskeletal: Neck supple.  Cardiovascular:     Rate and Rhythm: Normal rate and regular rhythm.     Pulses: Normal pulses.  Pulmonary:     Effort: Pulmonary effort is normal. No respiratory distress.     Breath sounds: Normal breath  sounds. No wheezing, rhonchi or rales.  Chest:     Chest wall: No tenderness.  Abdominal:     Palpations: Abdomen is soft.     Tenderness: There is abdominal tenderness (right-sided and suprapubic). There is right CVA tenderness. There is no left CVA tenderness, guarding or rebound.  Genitourinary:    General: Normal vulva.     Vagina: No bleeding.     Cervix: Discharge (thick white) present. No cervical motion tenderness or friability.     Adnexa:        Right: Tenderness present.        Left: No tenderness.    Musculoskeletal:     Right lower leg: No edema.     Left lower leg: No edema.     Comments: Positive straight leg raise on right  Skin:    General: Skin is warm and dry.  Neurological:     General: No focal deficit present.     Mental Status: She is alert and oriented to person, place, and time.     Cranial Nerves: No cranial nerve deficit.     Sensory: No sensory deficit.     Motor: No weakness.     Coordination: Coordination normal.      ED Treatments / Results  Labs (all labs ordered are listed, but only abnormal results are displayed) Labs Reviewed  COMPREHENSIVE METABOLIC PANEL - Abnormal; Notable for the following components:      Result Value   Glucose, Bld 139 (*)    AST 13 (*)    All other components within normal limits  URINALYSIS, ROUTINE W REFLEX MICROSCOPIC - Abnormal; Notable for the following components:   Color, Urine COLORLESS (*)    Specific Gravity, Urine 1.003 (*)    All other components within normal limits  WET PREP, GENITAL  LIPASE, BLOOD  CBC WITH DIFFERENTIAL/PLATELET  I-STAT BETA HCG BLOOD, ED ([facility], [facility], [facility] ONLY)  GC/CHLAMYDIA PROBE AMP ([place]) NOT AT [facility]    EKG None  Radiology [place] Transvaginal Non-ob  Result Date: 11/24/2018 CLINICAL DATA:  Right adnexal pain and tenderness on exam. Clinical suspicion for ovarian torsion. EXAM: TRANSABDOMINAL AND TRANSVAGINAL ULTRASOUND OF PELVIS DOPPLER ULTRASOUND OF OVARIES TECHNIQUE:  Both transabdominal and transvaginal ultrasound examinations of the pelvis were performed. Transabdominal technique was performed for global imaging of the pelvis including uterus, ovaries, adnexal regions, and pelvic cul-de-sac. It was necessary to proceed with endovaginal exam following the transabdominal exam to visualize the endometrium and ovaries. Color and duplex Doppler ultrasound was utilized to evaluate blood flow to the ovaries. COMPARISON:  None. FINDINGS: Uterus Measurements: 9.5 x 4.4 x 4.6 cm = volume: 101 mL. No fibroids or other mass visualized. Endometrium Thickness: 5 mm.  No focal abnormality visualized. Right ovary Measurements: 3.8 x 1.5 x 2.0 cm =  volume: 5.6 mL. Normal appearance/no adnexal mass. Left ovary Measurements: 2.9 x 2.3 x 2.2 cm = volume: 7.3 mL. Normal appearance/no adnexal mass. Pulsed Doppler evaluation of both ovaries demonstrates normal low-resistance arterial and venous waveforms. Other findings No abnormal free fluid. IMPRESSION: Negative. No pelvic mass or other significant abnormality identified. No sonographic evidence for ovarian torsion. Electronically Signed   By: Danae Orleans M.D.   On: 11/24/2018 21:31   US Pelvis Complete  Result Date: 11/24/2018 CLINICAL DATA:  Right adnexal pain and tenderness on exam. Clinical suspicion for ovarian torsion. EXAM: TRANSABDOMINAL AND TRANSVAGINAL ULTRASOUND OF PELVIS DOPPLER ULTRASOUND OF OVARIES TECHNIQUE: Both transabdominal and transvaginal ultrasound examinations of the pelvis were performed. Transabdominal technique was performed for global imaging of the pelvis including uterus, ovaries, adnexal regions, and pelvic cul-de-sac. It was necessary to proceed with endovaginal exam following the transabdominal exam to visualize the endometrium and ovaries. Color and duplex Doppler ultrasound was utilized to evaluate blood flow to the ovaries. COMPARISON:  None. FINDINGS: Uterus Measurements: 9.5 x 4.4 x 4.6 cm = volume: 101 mL.  No fibroids or other mass visualized. Endometrium Thickness: 5 mm.  No focal abnormality visualized. Right ovary Measurements: 3.8 x 1.5 x 2.0 cm = volume: 5.6 mL. Normal appearance/no adnexal mass. Left ovary Measurements: 2.9 x 2.3 x 2.2 cm = volume: 7.3 mL. Normal appearance/no adnexal mass. Pulsed Doppler evaluation of both ovaries demonstrates normal low-resistance arterial and venous waveforms. Other findings No abnormal free fluid. IMPRESSION: Negative. No pelvic mass or other significant abnormality identified. No sonographic evidence for ovarian torsion. Electronically Signed   By: Danae Orleans M.D.   On: 11/24/2018 21:31   Ct Abdomen Pelvis W Contrast  Result Date: 11/24/2018 CLINICAL DATA:  Hematuria and right flank pain. Recently treated for a UTI. EXAM: CT ABDOMEN AND PELVIS WITH CONTRAST TECHNIQUE: Multidetector CT imaging of the abdomen and pelvis was performed using the standard protocol following bolus administration of intravenous contrast. CONTRAST:  OMNIPAQUE IOHEXOL 300 MG/ML  SOLN COMPARISON:  None. FINDINGS: Lower chest: Clear lung bases.  Heart normal size. Hepatobiliary: No focal liver abnormality is seen. No gallstones, gallbladder wall thickening, or biliary dilatation. Pancreas: Unremarkable. No pancreatic ductal dilatation or surrounding inflammatory changes. Spleen: Normal in size without focal abnormality. Adrenals/Urinary Tract: Normal adrenal glands. Kidneys normal size, orientation and position with symmetric enhancement. 5 mm low-density lesion, lower pole the right kidney, likely a cyst. No other renal masses, no stones and no hydronephrosis. Normal ureters.  Normal bladder. Stomach/Bowel: Stomach is within normal limits. Appendix appears normal. No evidence of bowel wall thickening, distention, or inflammatory changes. Vascular/Lymphatic: No significant vascular findings are present. No enlarged abdominal or pelvic lymph nodes. Reproductive: Uterus and bilateral adnexa  are unremarkable. Other: No abdominal wall hernia or abnormality. No abdominopelvic ascites. Musculoskeletal: No acute or significant osseous findings. IMPRESSION: 1. No acute findings. No renal or ureteral stones. No obstructive uropathy. No CT evidence of pyelonephritis. No bladder wall thickening to suggest cystitis. 2. Small low-density right renal mass consistent with a cyst. No other abnormalities. Electronically Signed   By: Amie Portland M.D.   On: 11/24/2018 18:01   [place] Art/ven Flow Abd Pelv Doppler  Result Date: 11/24/2018 CLINICAL DATA:  Right adnexal pain and tenderness on exam. Clinical suspicion for ovarian torsion. EXAM: TRANSABDOMINAL AND TRANSVAGINAL ULTRASOUND OF PELVIS DOPPLER ULTRASOUND OF OVARIES TECHNIQUE: Both transabdominal and transvaginal ultrasound examinations of the pelvis were performed. Transabdominal technique was performed for global imaging of the  pelvis including uterus, ovaries, adnexal regions, and pelvic cul-de-sac. It was necessary to proceed with endovaginal exam following the transabdominal exam to visualize the endometrium and ovaries. Color and duplex Doppler ultrasound was utilized to evaluate blood flow to the ovaries. COMPARISON:  None. FINDINGS: Uterus Measurements: 9.5 x 4.4 x 4.6 cm = volume: 101 mL. No fibroids or other mass visualized. Endometrium Thickness: 5 mm.  No focal abnormality visualized. Right ovary Measurements: 3.8 x 1.5 x 2.0 cm = volume: 5.6 mL. Normal appearance/no adnexal mass. Left ovary Measurements: 2.9 x 2.3 x 2.2 cm = volume: 7.3 mL. Normal appearance/no adnexal mass. Pulsed Doppler evaluation of both ovaries demonstrates normal low-resistance arterial and venous waveforms. Other findings No abnormal free fluid. IMPRESSION: Negative. No pelvic mass or other significant abnormality identified. No sonographic evidence for ovarian torsion. Electronically Signed   By: Marlaine Hind M.D.   On: 11/24/2018 21:31    Procedures Procedures  (including critical care time)  Medications Ordered in ED Medications  acetaminophen (TYLENOL) tablet 1,000 mg (1,000 mg Oral Given 11/24/18 1539)  iohexol (OMNIPAQUE) 300 MG/ML solution 100 mL (100 mLs Intravenous Contrast Given 11/24/18 1718)  ketorolac (TORADOL) 15 MG/ML injection 15 mg (15 mg Intravenous Given 11/24/18 2237)     Initial Impression / Assessment and Plan / ED Course  I have reviewed the triage vital signs and the nursing notes.  Pertinent labs & imaging results that were available during my care of the patient were reviewed by me and considered in my medical decision making (see chart for details).        Concern from possible pyelonephritis versus nephrolithiasis versus musculoskeletal/sciatic pain versus other intra-abdominal pathology.  Patient without hematuria on urine studies here.  Laboratory studies unrevealing.  Negative urine pregnancy test.  No vaginal symptoms, will defer pelvic exam at this time.  Positive straight leg raise and right-sided CVA tenderness.  Abdomen is soft and mildly tender suprapubically and right sided.  Patient given Tylenol for pain.  CTA of the abdomen pelvis with contrast largely unrevealing without evidence of pyelonephritis, nephrolithiasis, and normal appendix, small renal cyst noted.  Pelvic exam with some right adnexal tenderness and whitish thick discharge.  Wet prep unrevealing, GC/chlamydia pending.  Patient declines empiric treatment today encouraged to follow-up with her primary care provider for results.  Patient does not have cervical friability or cervical motion tenderness.  Pelvic ultrasound was performed which did not show evidence of TOA or torsion.  Patient given Toradol for pain.  All questions answered and strict return cautions given.  Patient advised to try Azo for symptoms of dysuria.  Patient comfortable with plan to discharge home, continue supportive care, and closely follow-up with her primary care provider.  Patient  seen and plan discussed with Dr. Sedonia Small.  Final Clinical Impressions(s) / ED Diagnoses   Final diagnoses:  Right lower quadrant abdominal pain  Dysuria  Flank pain    ED Discharge Orders    None       Betsey Amen, MD 11/25/18 0045    Maudie Flakes, MD 11/25/18 2344

## 2018-11-24 NOTE — ED Notes (Signed)
Patient transported to CT 

## 2018-11-24 NOTE — ED Notes (Signed)
Patient verbalizes understanding of discharge instructions. Opportunity for questioning and answers were provided. Armband removed by staff, pt discharged from ED ambulatory.   

## 2018-11-24 NOTE — ED Triage Notes (Signed)
Pt sent here from [facility] concerning some hematuria and right side flank pain pt was recently treated for an UTI

## 2018-11-26 LAB — GC/CHLAMYDIA PROBE AMP (~~LOC~~) NOT AT ARMC
Chlamydia: NEGATIVE
Neisseria Gonorrhea: NEGATIVE

## 2018-12-11 ENCOUNTER — Other Ambulatory Visit: Payer: Self-pay

## 2018-12-11 ENCOUNTER — Ambulatory Visit (INDEPENDENT_AMBULATORY_CARE_PROVIDER_SITE_OTHER): Payer: Medicaid Other | Admitting: Obstetrics and Gynecology

## 2018-12-11 ENCOUNTER — Encounter: Payer: Self-pay | Admitting: Obstetrics and Gynecology

## 2018-12-11 VITALS — BP 126/80 | HR 65 | Ht 68.0 in | Wt 184.4 lb

## 2018-12-11 DIAGNOSIS — Z87440 Personal history of urinary (tract) infections: Secondary | ICD-10-CM

## 2018-12-11 DIAGNOSIS — R102 Pelvic and perineal pain: Secondary | ICD-10-CM | POA: Diagnosis not present

## 2018-12-11 NOTE — Progress Notes (Signed)
Patient comes in today as new patient. She had C section 2018 and never followed up from that. She is having lower abdominal pain. She finished Cipro yesterday for UTI.

## 2018-12-11 NOTE — Progress Notes (Signed)
HPI:      Karen Gaines is a 33 y.o. No obstetric history on file. who LMP was Patient's last menstrual period was 12/05/2018.  Subjective:   She presents today stating that she has recurrent UTIs over the last 5 months in a row.  She has been treated for each 1 and during her treatment her pelvic pain and discomfort tends to improve but not resolved.  After stopping her antibiotics her pain returns.  She has seen urology in [place] and has a follow-up appointment scheduled there.  They have not come to any diagnosis yet. Patient is not currently sexually active.  She has normal regular cycles each menses lasting approximately 3 days.  She does not complain of significant dysmenorrhea.  She does state that she was previously on OCPs and liked being on them. Her last birth was a cesarean delivery 2 years ago.    Hx: The following portions of the patient's history were reviewed and updated as appropriate:             She  has a past medical history of Complication of anesthesia and GERD (gastroesophageal reflux disease). She does not have a problem list on file. She  has a past surgical history that includes Cesarean section and Hemorrhoid surgery (N/A, 05/09/2018). Her family history is not on file. She  reports that she has never smoked. She has never used smokeless tobacco. She reports that she does not drink alcohol or use drugs. She has a current medication list which includes the following prescription(s): trimethoprim and uribel. She has No Known Allergies.       Review of Systems:  Review of Systems  Constitutional: Denied constitutional symptoms, night sweats, recent illness, fatigue, fever, insomnia and weight loss.  Eyes: Denied eye symptoms, eye pain, photophobia, vision change and visual disturbance.  Ears/Nose/Throat/Neck: Denied ear, nose, throat or neck symptoms, hearing loss, nasal discharge, sinus congestion and sore throat.  Cardiovascular: Denied cardiovascular  symptoms, arrhythmia, chest pain/pressure, edema, exercise intolerance, orthopnea and palpitations.  Respiratory: Denied pulmonary symptoms, asthma, pleuritic pain, productive sputum, cough, dyspnea and wheezing.  Gastrointestinal: Denied, gastro-esophageal reflux, melena, nausea and vomiting.  Genitourinary: See HPI for additional information.  Musculoskeletal: Denied musculoskeletal symptoms, stiffness, swelling, muscle weakness and myalgia.  Dermatologic: Denied dermatology symptoms, rash and scar.  Neurologic: Denied neurology symptoms, dizziness, headache, neck pain and syncope.  Psychiatric: Denied psychiatric symptoms, anxiety and depression.  Endocrine: Denied endocrine symptoms including hot flashes and night sweats.   Meds:   Current Outpatient Medications on File Prior to Visit  Medication Sig Dispense Refill  . trimethoprim (TRIMPEX) 100 MG tablet Take 100 mg by mouth 2 (two) times daily.    . Meth-Hyo-M Bl-Na Phos-Ph Sal (URIBEL) 118 MG CAPS TAKE 1 CAPSULE BY MOUTH EVERY 8 HOURS AS NEEDED     No current facility-administered medications on file prior to visit.     Objective:     Vitals:   12/11/18 1051  BP: 126/80  Pulse: 65                Assessment:    No obstetric history on file. There are no active problems to display for this patient.    1. History of recurrent UTIs   2. Pelvic pain in female     Patient sounds as if she has pelvic pain secondary to a urologic issue.  Does not seem cycle related.  Remote chance of endometriosis possible although improvement of pain with antibiotics  is not consistent.   Plan:            1.  I recommend she continue her follow-up with [facility] urology especially in light of the multiple recurrent UTIs.  We have discussed the possibility of interstitial cystitis and I have asked her to speak with them about this.  If their work-up is negative and she continues to experience pelvic pain I have asked her to reschedule an  appointment with me.  We will consider restarting OCPs and a further work-up including ultrasound if necessary. Orders No orders of the defined types were placed in this encounter.   No orders of the defined types were placed in this encounter.     F/U  No follow-ups on file. I spent 32 minutes involved in the care of this patient of which greater than 50% was spent discussing pelvic pain, previous work-up, urologic symptoms, previous imaging, history of menstrual cycles, cesarean delivery, future possible work-up if necessary.  All questions answered.  Elonda Husky, M.D. 12/11/2018 11:37 AM

## 2019-12-15 ENCOUNTER — Encounter (HOSPITAL_COMMUNITY): Payer: Self-pay | Admitting: Emergency Medicine

## 2019-12-15 ENCOUNTER — Emergency Department (HOSPITAL_COMMUNITY): Payer: Medicaid Other

## 2019-12-15 ENCOUNTER — Other Ambulatory Visit: Payer: Self-pay

## 2019-12-15 ENCOUNTER — Emergency Department (HOSPITAL_COMMUNITY)
Admission: EM | Admit: 2019-12-15 | Discharge: 2019-12-16 | Disposition: A | Discharge status: Home / Self Care | Payer: Medicaid Other | Attending: Emergency Medicine | Admitting: Emergency Medicine

## 2019-12-15 DIAGNOSIS — G5601 Carpal tunnel syndrome, right upper limb: Secondary | ICD-10-CM | POA: Insufficient documentation

## 2019-12-15 DIAGNOSIS — R079 Chest pain, unspecified: Secondary | ICD-10-CM | POA: Diagnosis not present

## 2019-12-15 LAB — CBC
HCT: 38.3 % (ref 36.0–46.0)
Hemoglobin: 12.9 g/dL (ref 12.0–15.0)
MCH: 30.2 pg (ref 26.0–34.0)
MCHC: 33.7 g/dL (ref 30.0–36.0)
MCV: 89.7 fL (ref 80.0–100.0)
Platelets: 292 10*3/uL (ref 150–400)
RBC: 4.27 MIL/uL (ref 3.87–5.11)
RDW: 12.6 % (ref 11.5–15.5)
WBC: 7.1 10*3/uL (ref 4.0–10.5)
nRBC: 0 % (ref 0.0–0.2)

## 2019-12-15 LAB — BASIC METABOLIC PANEL
Anion gap: 9 (ref 5–15)
BUN: 8 mg/dL (ref 6–20)
CO2: 23 mmol/L (ref 22–32)
Calcium: 8.8 mg/dL — ABNORMAL LOW (ref 8.9–10.3)
Chloride: 105 mmol/L (ref 98–111)
Creatinine, Ser: 0.71 mg/dL (ref 0.44–1.00)
GFR calc non Af Amer: >60 mL/min (ref >60)
Glucose, Bld: 117 mg/dL — ABNORMAL HIGH (ref 70–99)
Potassium: 3.8 mmol/L (ref 3.5–5.1)
Sodium: 137 mmol/L (ref 135–145)

## 2019-12-15 LAB — I-STAT BETA HCG BLOOD, ED (MC, WL, AP ONLY): I-stat hCG, quantitative: <5 m[IU]/mL (ref <5)

## 2019-12-15 LAB — TROPONIN I (HIGH SENSITIVITY): Troponin I (High Sensitivity): 3 ng/L (ref <18)

## 2019-12-15 NOTE — ED Triage Notes (Signed)
Pt presents to ED POV. Pt c/o mid CP that radiates to R arm and shoulder. Pt reports that pain started x2d. Pt reports that she tried to take tylenol w/ no relief. No cardiac hx

## 2019-12-16 LAB — HEPATIC FUNCTION PANEL
ALT: 12 U/L (ref 0–44)
AST: 15 U/L (ref 15–41)
Albumin: 3.8 g/dL (ref 3.5–5.0)
Alkaline Phosphatase: 52 U/L (ref 38–126)
Bilirubin, Direct: <0.1 mg/dL (ref 0.0–0.2)
Total Bilirubin: 0.4 mg/dL (ref 0.3–1.2)
Total Protein: 6.7 g/dL (ref 6.5–8.1)

## 2019-12-16 LAB — LIPASE, BLOOD: Lipase: 30 U/L (ref 11–51)

## 2019-12-16 LAB — TROPONIN I (HIGH SENSITIVITY): Troponin I (High Sensitivity): 2 ng/L (ref <18)

## 2019-12-16 MED ORDER — LIDOCAINE VISCOUS HCL 2 % MT SOLN
15.0000 mL | Freq: Once | OROMUCOSAL | Status: AC
  Administered 2019-12-16: 15 mL via ORAL
  Filled 2019-12-16: qty 15

## 2019-12-16 MED ORDER — ALUM & MAG HYDROXIDE-SIMETH 200-200-20 MG/5ML PO SUSP
30.0000 mL | Freq: Once | ORAL | Status: AC
  Administered 2019-12-16: 30 mL via ORAL
  Filled 2019-12-16: qty 30
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
## 2019-12-16 NOTE — Discharge Instructions (Signed)
Your evaluation in the emergency department did not reveal a concerning cause of your chest pain.  We recommend follow-up with your primary care doctor if symptoms persist.  We advise bracing and the use of NSAIDs, such as ibuprofen, for management of your wrist pain.  We suspect that you are suffering from carpal tunnel.  If you continue to have ongoing pain and tingling, follow-up with a hand specialist.  You have been provided a referral for follow-up, if desired.  Return to the ED for new or concerning symptoms.

## 2019-12-30 ENCOUNTER — Encounter: Payer: Self-pay | Admitting: Plastic Surgery

## 2019-12-30 ENCOUNTER — Other Ambulatory Visit: Payer: Self-pay

## 2019-12-30 ENCOUNTER — Ambulatory Visit (INDEPENDENT_AMBULATORY_CARE_PROVIDER_SITE_OTHER): Payer: Medicaid Other | Admitting: Plastic Surgery

## 2019-12-30 VITALS — BP 132/84 | HR 70 | Temp 98.1°F | Ht 65.5 in | Wt 190.0 lb

## 2019-12-30 DIAGNOSIS — G5601 Carpal tunnel syndrome, right upper limb: Secondary | ICD-10-CM

## 2019-12-30 NOTE — Progress Notes (Signed)
   Referring Provider No referring provider defined for this encounter.   CC:  Chief Complaint  Patient presents with  . Consult      Karen Gaines is an 34 y.o. female.  HPI: Patient presents with pain and tingling in the right hand and wrist.  Is been present for at least 6 months and seems to be exacerbated by repetitive tasks.  She was seen in the emergency room for pain radiating from her wrist to her shoulder with additional complaints of what she describes as tingling primarily in the long finger.  Her exam was suggestive of carpal tunnel syndrome so she was referred here.  She has not had any trauma or falls.  She has been wearing a wrist brace during the day and at night with little relief.  No Known Allergies  Outpatient Encounter Medications as of 12/30/2019  Medication Sig  . Meth-Hyo-M Bl-Na Phos-Ph Sal (URIBEL) 118 MG CAPS TAKE 1 CAPSULE BY MOUTH EVERY 8 HOURS AS NEEDED  . trimethoprim (TRIMPEX) 100 MG tablet Take 100 mg by mouth 2 (two) times daily.   No facility-administered encounter medications on file as of 12/30/2019.     Past Medical History:  Diagnosis Date  . Complication of anesthesia    HAD EMERGENCY SECTION AND SAID SHE WAS NOT NUMB AT ALL AND FELT EVERYTHING  . GERD (gastroesophageal reflux disease)    RARE-NO MEDS    Past Surgical History:  Procedure Laterality Date  . CESAREAN SECTION     X 2  . HEMORRHOID SURGERY N/A 05/09/2018   Procedure: HEMORRHOIDECTOMY AND SKIN TAG REMOVAL;  Surgeon: Sung Amabile, DO;  Location: [facility] ORS;  Service: General;  Laterality: N/A;    No family history on file.  Social History   Social History Narrative  . Not on file     Review of Systems General: Denies fevers, chills, weight loss CV: Denies chest pain, shortness of breath, palpitations  Physical Exam Vitals with BMI 12/30/2019 12/15/2019 12/11/2018  Height 5' 5.5" - 5\' 8"   Weight 190 lbs - 184 lbs 6 oz  BMI 31.13 - 28.04  Systolic 132 129    Diastolic 84 89 80  Pulse 70 72 65    General:  No acute distress,  Alert and oriented, Non-Toxic, Normal speech and affect Right hand: Fingers well-perfused with normal capillary refill and a palpable radial pulse.  Sensation is intact throughout although she reports different sensation in the median distribution compared to the small finger.  Persistent pressure over the carpal tunnel causes tingling in the long finger.  Provocative test for cubital tunnel syndrome are negative.  She has normal range of motion of her fingers and thumb.  I do not feel any thenar atrophy.  Assessment/Plan Patient presents with exam consistent with carpal tunnel syndrome.  I Minna send her for a nerve study to evaluate.  After that we will see her back and discuss the options.  I explained that if it would be positive the options would be a steroid injection or more favorably a carpal tunnel release to give a definitive long-term improvement.  She voiced understanding and will see her back at that time.  169 12/30/2019, 10:43 AM

## 2020-01-20 ENCOUNTER — Other Ambulatory Visit: Payer: Self-pay

## 2020-01-20 DIAGNOSIS — G5601 Carpal tunnel syndrome, right upper limb: Secondary | ICD-10-CM

## 2020-01-21 ENCOUNTER — Ambulatory Visit (INDEPENDENT_AMBULATORY_CARE_PROVIDER_SITE_OTHER): Payer: Medicaid Other | Admitting: Diagnostic Neuroimaging

## 2020-01-21 ENCOUNTER — Encounter: Payer: Medicaid Other | Admitting: Diagnostic Neuroimaging

## 2020-01-21 ENCOUNTER — Other Ambulatory Visit: Payer: Self-pay

## 2020-01-21 DIAGNOSIS — G5601 Carpal tunnel syndrome, right upper limb: Secondary | ICD-10-CM | POA: Diagnosis not present

## 2020-01-21 DIAGNOSIS — Z0289 Encounter for other administrative examinations: Secondary | ICD-10-CM

## 2020-01-21 NOTE — Procedures (Signed)
   [facility]  NCS (NERVE CONDUCTION STUDY) WITH EMG (ELECTROMYOGRAPHY) REPORT   STUDY DATE: 01/21/20 PATIENT NAME: Karen Gaines DOB: 04-22-1985 MRN: [id number]  ORDERING CLINICIAN: Merry Proud, MD  TECHNOLOGIST: Durenda Age ELECTROMYOGRAPHER: Glenford Bayley. Karrine Kluttz, MD  CLINICAL INFORMATION: 34 year old female with right wrist pain.  FINDINGS: NERVE CONDUCTION STUDY:  Right median and right ulnar motor responses are normal.  Right median and right ulnar sensory responses are normal.  Right median to ulnar transcarpal mixed nerve comparison study is normal.  Right ulnar F-wave latency is normal.   NEEDLE ELECTROMYOGRAPHY:  Needle examination of right upper extremity is normal.   IMPRESSION:   This is a normal study.  No electrodiagnostic evidence of large fiber neuropathy at this time.    INTERPRETING PHYSICIAN:  Suanne Marker, MD Certified in Neurology, Neurophysiology and Neuroimaging  [facility] [address], [address] [place], [place] [address] [phone]   [facility]    Nerve / Sites Muscle Latency Ref. Amplitude Ref. Rel Amp Segments Distance Velocity Ref. Area    ms ms mV mV %  cm m/s m/s mVms  R Median - APB     Wrist APB 3.0 ?4.4 7.3 ?4.0 100 Wrist - APB 7   27.1     Upper arm APB 7.0  7.1  96.8 Upper arm - Wrist 24 60 ?49 25.6  R Ulnar - ADM     Wrist ADM 2.3 ?3.3 10.9 ?6.0 100 Wrist - ADM 7   35.4     B.Elbow ADM 5.9  9.0  82.2 B.Elbow - Wrist 22 61 ?49 32.6     A.Elbow ADM 7.7  8.9  99 A.Elbow - B.Elbow 10 55 ?49 32.1         A.Elbow - Wrist             SNC    Nerve / Sites Rec. Site Peak Lat Ref.  Amp Ref. Segments Distance Peak Diff Ref.    ms ms V V  cm ms ms  R Median, Ulnar - Transcarpal comparison     Median Palm Wrist 1.8 ?2.2 94 ?35 Median Palm - Wrist 8       Ulnar Palm Wrist 1.8 ?2.2 43 ?12 Ulnar Palm - Wrist 8          Median Palm - Ulnar Palm  -0.0 ?0.4  R Median - Orthodromic (Dig II, Mid  palm)     Dig II Wrist 2.6 ?3.4 13 ?10 Dig II - Wrist 13    R Ulnar - Orthodromic, (Dig V, Mid palm)     Dig V Wrist 2.7 ?3.1 12 ?5 Dig V - Wrist 46             F  Wave    Nerve F Lat Ref.   ms ms  R Ulnar - ADM 27.2 ?32.0       EMG Summary Table    Spontaneous MUAP Recruitment  Muscle IA Fib PSW Fasc Other Amp Dur. Poly Pattern  R. Deltoid Normal None None None _______ Normal Normal Normal Normal  R. Biceps brachii Normal None None None _______ Normal Normal Normal Normal  R. Triceps brachii Normal None None None _______ Normal Normal Normal Normal  R. Flexor carpi radialis Normal None None None _______ Normal Normal Normal Normal  R. First dorsal interosseous Normal None None None _______ Normal Normal Normal Normal

## 2020-02-17 ENCOUNTER — Ambulatory Visit (INDEPENDENT_AMBULATORY_CARE_PROVIDER_SITE_OTHER): Payer: Medicaid Other | Admitting: Plastic Surgery

## 2020-02-17 ENCOUNTER — Encounter: Payer: Self-pay | Admitting: Plastic Surgery

## 2020-02-17 ENCOUNTER — Other Ambulatory Visit: Payer: Self-pay

## 2020-02-17 VITALS — BP 136/85 | HR 66 | Temp 97.8°F | Ht 68.0 in | Wt 180.0 lb

## 2020-02-17 DIAGNOSIS — G5601 Carpal tunnel syndrome, right upper limb: Secondary | ICD-10-CM | POA: Diagnosis not present

## 2020-02-17 NOTE — Progress Notes (Signed)
   Referring Provider No referring provider defined for this encounter.   CC: No chief complaint on file.     Karen Gaines is an 33 y.o. female.  HPI: Patient presents in follow-up for symptoms of her right hand.  Clinically she appeared to have carpal tunnel syndrome with numbness in the median distribution but got worse at night.  She also describes some weakness.  She has been wearing a brace quite a bit with little sustained relief.  She did get a nerve study which showed no evidence of carpal tunnel based on their metrics.  She is here to discuss the results.  Review of Systems General: Denies fevers or chills  Physical Exam Vitals with BMI 02/17/2020 12/30/2019 12/15/2019  Height 5\' 8"  5' 5.5" -  Weight 180 lbs 190 lbs -  BMI 27.38 31.13 -  Systolic 136 132  Diastolic 85 84 89  Pulse 66 70 72    General:  No acute distress,  Alert and oriented, Non-Toxic, Normal speech and affect Examination remains consistent with carpal tunnel syndrome.  She does have baseline tingling in the long finger that is made worse with median nerve compression test at the wrist.  Assessment/Plan I discussed with the patient about the findings.  I explained that while she may still have carpal tunnel syndrome the diagnostic studies did not support it.  I offered her a steroid injection which should be therapeutic and diagnostic.  I discussed the risk of steroid injection that include bleeding, infection, damage to surrounding structures and need for additional procedures.  She is in agreement and wanted to move forward.  The volar aspect of the wrist was prepped with an alcohol pad and 1 cc of Kenalog 40 combined with 1 cc of lidocaine with epinephrine was injected into the carpal tunnel.  The patient denied any shocking sensations while the injection was being carried out.  After the injection she reported numbness in the median distribution indicating the injection was in the appropriate location.  I  will plan to see her again in about 6 weeks to see if she got any benefit from this if she does get some benefit that would indicate that she does not fact have carpal tunnel syndrome.  At that point if her symptoms did recur then I could more confidently offer her a carpal tunnel release.  She seemed well understanding of this and will plan to see her at her next visit.  924 02/17/2020, 10:02 AM

## 2020-03-30 ENCOUNTER — Encounter: Payer: Self-pay | Admitting: Plastic Surgery

## 2020-03-30 ENCOUNTER — Ambulatory Visit (INDEPENDENT_AMBULATORY_CARE_PROVIDER_SITE_OTHER): Payer: Medicaid Other | Admitting: Plastic Surgery

## 2020-03-30 ENCOUNTER — Other Ambulatory Visit: Payer: Self-pay

## 2020-03-30 VITALS — BP 132/85 | HR 73

## 2020-03-30 DIAGNOSIS — G5601 Carpal tunnel syndrome, right upper limb: Secondary | ICD-10-CM

## 2020-03-30 NOTE — Progress Notes (Signed)
Patient is here in follow-up for her right hand.  There was initially concern for carpal tunnel syndrome but her nerve study was negative.  I performed a diagnostic and therapeutic carpal tunnel injection with steroid at her last visit which was about 6 weeks ago.  She feels like the symptoms distally in her hand and fingers have improved quite a bit.  She no longer has the numbness and tingling that was present before.  However now she is complaining of pain proximal to the area of the injection that gets worse with movement and stress.  On examination she demonstrates normal sensation in her hand and fingers.  Just proximal to the wrist and the level of the forearm flexors she has some mild tenderness to palpation and it seems to get worse when she stresses the finger and wrist flexors.  There is no swelling to speak of.  I suspect she has some tendinitis in that area and have recommended therapy.  I explained that I do not expect that there is a good surgical solution for that problem.  I am going to get an x-ray of her right wrist to see if there is any underlying pathology there.  If there is anything alarming I will make sure to contact her otherwise I will check her progress in around 2 more months.

## 2020-04-06 ENCOUNTER — Ambulatory Visit: Payer: Medicaid Other | Admitting: Occupational Therapy

## 2020-04-12 ENCOUNTER — Ambulatory Visit (HOSPITAL_COMMUNITY)
Admission: RE | Admit: 2020-04-12 | Discharge: 2020-04-12 | Disposition: A | Discharge status: Home / Self Care | Payer: Medicaid Other | Source: Ambulatory Visit | Attending: Plastic Surgery | Admitting: Plastic Surgery

## 2020-04-12 DIAGNOSIS — G5601 Carpal tunnel syndrome, right upper limb: Secondary | ICD-10-CM

## 2020-05-09 ENCOUNTER — Other Ambulatory Visit: Payer: Self-pay

## 2020-05-09 ENCOUNTER — Encounter (HOSPITAL_COMMUNITY): Payer: Self-pay

## 2020-05-09 ENCOUNTER — Emergency Department (HOSPITAL_COMMUNITY): Payer: Medicaid Other

## 2020-05-09 ENCOUNTER — Emergency Department (HOSPITAL_COMMUNITY)
Admission: EM | Admit: 2020-05-09 | Discharge: 2020-05-09 | Disposition: A | Discharge status: Home / Self Care | Payer: Medicaid Other | Attending: Emergency Medicine | Admitting: Emergency Medicine

## 2020-05-09 DIAGNOSIS — Y92410 Unspecified street and highway as the place of occurrence of the external cause: Secondary | ICD-10-CM | POA: Insufficient documentation

## 2020-05-09 DIAGNOSIS — M25512 Pain in left shoulder: Secondary | ICD-10-CM | POA: Diagnosis not present

## 2020-05-09 DIAGNOSIS — S6991XA Unspecified injury of right wrist, hand and finger(s), initial encounter: Secondary | ICD-10-CM | POA: Diagnosis present

## 2020-05-09 DIAGNOSIS — S60221A Contusion of right hand, initial encounter: Secondary | ICD-10-CM | POA: Diagnosis not present
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
## 2020-05-09 NOTE — ED Notes (Signed)
An After Visit Summary was printed and given to the patient. Discharge instructions given and no further questions at this time.  Pt leaving with family.  

## 2020-05-09 NOTE — Discharge Instructions (Addendum)
X-rays of your shoulder and hand are normal today. Apply warm compresses to sore muscles for 20 minutes at a time. Follow up with your doctor as needed. Take Motrin and Tylenol as needed as directed.

## 2020-07-06 ENCOUNTER — Ambulatory Visit (INDEPENDENT_AMBULATORY_CARE_PROVIDER_SITE_OTHER): Payer: Medicaid Other | Admitting: Plastic Surgery

## 2020-07-06 ENCOUNTER — Other Ambulatory Visit: Payer: Self-pay

## 2020-07-06 DIAGNOSIS — G5601 Carpal tunnel syndrome, right upper limb: Secondary | ICD-10-CM | POA: Diagnosis not present

## 2020-07-06 NOTE — Progress Notes (Signed)
Patient presents for follow-up of her right hand.  There was initially concern for carpal tunnel syndrome but her nerve study was negative.  I did inject the carpal tunnel with steroids and she did get about 6 weeks of relief from that but has also developed pain proximal to that in the forearm.  I brought up therapy for her but she does not seem interested in pursuing that and does not think it will help.  Current she currently she has full range of motion in her fingers and wrist.  She does report numbness and tingling in the long and ring fingers with median nerve compression test at the carpal tunnel.  I long discussion with the patient about her options.  I did offer to release the carpal tunnel for her.  I explained that I am not sure that this would treat the more proximal pain but it is possible that it would.  I do feel that improvement with a steroid injection is a fairly diagnostic tool for carpal tunnel syndrome even in the presence of a negative nerve study.  Patient is hesitant to move forward with surgery as she has had some bad experience with surgical procedures in the past.  I did offer to send her to another hand surgeon in town for a second opinion regarding her symptoms.  She seems interested in that and so we will refer her out for now but I explained I am happy to see her again in treater in the future if she would like.

## 2022-01-11 ENCOUNTER — Ambulatory Visit: Payer: Medicaid Other | Admitting: Dermatology

## 2022-01-22 ENCOUNTER — Ambulatory Visit: Payer: Medicaid Other | Admitting: Dermatology

## 2022-05-01 DIAGNOSIS — M79671 Pain in right foot: Secondary | ICD-10-CM | POA: Insufficient documentation

## 2022-05-01 DIAGNOSIS — R5382 Chronic fatigue, unspecified: Secondary | ICD-10-CM | POA: Insufficient documentation

## 2022-05-01 DIAGNOSIS — M2550 Pain in unspecified joint: Secondary | ICD-10-CM | POA: Insufficient documentation

## 2022-07-03 DIAGNOSIS — R030 Elevated blood-pressure reading, without diagnosis of hypertension: Secondary | ICD-10-CM | POA: Insufficient documentation

## 2022-07-03 DIAGNOSIS — G894 Chronic pain syndrome: Secondary | ICD-10-CM | POA: Insufficient documentation

## 2022-07-03 DIAGNOSIS — R252 Cramp and spasm: Secondary | ICD-10-CM | POA: Insufficient documentation

## 2022-11-28 DIAGNOSIS — G5601 Carpal tunnel syndrome, right upper limb: Secondary | ICD-10-CM | POA: Insufficient documentation
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
## 2023-08-21 ENCOUNTER — Encounter: Payer: Self-pay | Admitting: Family Medicine

## 2023-08-21 ENCOUNTER — Ambulatory Visit (INDEPENDENT_AMBULATORY_CARE_PROVIDER_SITE_OTHER): Admitting: Family Medicine

## 2023-08-21 VITALS — BP 139/86 | HR 76 | Temp 98.1°F | Resp 20 | Ht 68.0 in | Wt 183.0 lb

## 2023-08-21 DIAGNOSIS — K219 Gastro-esophageal reflux disease without esophagitis: Secondary | ICD-10-CM | POA: Diagnosis not present

## 2023-08-21 DIAGNOSIS — I10 Essential (primary) hypertension: Secondary | ICD-10-CM | POA: Insufficient documentation

## 2023-08-21 MED ORDER — AMLODIPINE BESYLATE 5 MG PO TABS: 5.0000 mg | ORAL_TABLET | Freq: Every day | ORAL | 1 refills | Status: DC

## 2023-08-21 MED ORDER — OMEPRAZOLE 20 MG PO CPDR: 20.0000 mg | DELAYED_RELEASE_CAPSULE | Freq: Every day | ORAL | 1 refills | Status: AC

## 2023-08-21 NOTE — Assessment & Plan Note (Signed)
 On amlodipine 5mg  daily with marginal control.  Discussed reducing the salt in her diet to help control her blood pressure.  Information on low salt cooking given.  Please check your blood pressure at home and record.  Please return in two weeks.  If blood pressure control is not achieved will consider adding low dose hydrochlorothiazide to amlodipine 5mg .

## 2023-08-21 NOTE — Assessment & Plan Note (Signed)
 Chest pain has resolved since taking omeprazole.  Refilled this for her.

## 2023-08-21 NOTE — Progress Notes (Signed)
 New Patient Office Visit  Subjective    Patient ID: Karen Gaines, female    DOB: 1985/03/29  Age: 38 y.o. MRN: [id number]  CC:  Chief Complaint  Patient presents with   Establish Care    HPI Karen Gaines presents to establish care.  Delightful 38 year old with hypertension, GERD, carpal tunnel (right) and RLS, s/p cesarean section x 2. She reports she has been going to urgent care.  Initially she was started on lisinopril but developed a cough.  Then she was started her on Norvasc 5 mg 2 weeks ago.  Reports her blood pressures at home are running in the 150s over 90s.  Since starting amlodipine 5 mg her blood pressures have been running in the 130s to 140s over 85-90.  She reports that she rarely eats out but she does use salt in preparing her food.  Her father died young at war but her paternal uncles have hypertension.  No hypertension on her mother's side.  She exercises when she can by walking in the neighborhood for 40 to 60 minutes. She reports she has been having some chest pain and she was given omeprazole at urgent care and her pain is much improved. She has carpal tunnel in the right (dominant) hand that she describes as terrible.  She has a cock up wrist splint but she cannot wear it at night because she takes it off in her sleep. She has restless leg syndrome and does not take anything for this. Last Pap was about 3 years ago and normal per her.  Chart reveals a Pap 12/28/2014 that was normal and transformation zone was present.   Outpatient Encounter Medications as of 08/21/2023  Medication Sig   [DISCONTINUED] amLODipine (NORVASC) 5 MG tablet Take 5 mg by mouth daily.   [DISCONTINUED] omeprazole (PRILOSEC) 20 MG capsule Take 20 mg by mouth daily.   amLODipine (NORVASC) 5 MG tablet Take 1 tablet (5 mg total) by mouth daily.   Meth-Hyo-M Bl-Na Phos-Ph Sal (URIBEL) 118 MG CAPS TAKE 1 CAPSULE BY MOUTH EVERY 8 HOURS AS NEEDED (Patient not taking: Reported on 08/21/2023)    omeprazole (PRILOSEC) 20 MG capsule Take 1 capsule (20 mg total) by mouth daily.   trimethoprim (TRIMPEX) 100 MG tablet Take 100 mg by mouth 2 (two) times daily. (Patient not taking: Reported on 08/21/2023)   No facility-administered encounter medications on file as of 08/21/2023.    Past Medical History:  Diagnosis Date   Complication of anesthesia    HAD EMERGENCY SECTION AND SAID SHE WAS NOT NUMB AT ALL AND FELT EVERYTHING   GERD (gastroesophageal reflux disease)    RARE-NO MEDS   Hypertension     Past Surgical History:  Procedure Laterality Date   CESAREAN SECTION     X 2   HEMORRHOID SURGERY N/A 05/09/2018   Procedure: HEMORRHOIDECTOMY AND SKIN TAG REMOVAL;  Surgeon: Conrado Delay, DO;  Location: [facility] ORS;  Service: General;  Laterality: N/A;    History reviewed. No pertinent family history.  Social History   Socioeconomic History   Marital status: Single    Spouse name: Not on file   Number of children: Not on file   Years of education: Not on file   Highest education level: Not on file  Occupational History   Not on file  Tobacco Use   Smoking status: Never    Passive exposure: Never   Smokeless tobacco: Never  Vaping Use   Vaping status: Never Used  Substance and Sexual  Activity   Alcohol use: Never   Drug use: Never   Sexual activity: Not on file  Other Topics Concern   Not on file  Social History Narrative   Not on file   Social Drivers of Health   Financial Resource Strain: Not on File (11/03/2017)   Received from [employer]    Financial Resource Strain: 0  Food Insecurity: Not on File (11/03/2017)   Received from [employer]    Food: 0  Transportation Needs: Not on File (11/03/2017)   Received from [employer] [facility]    Transportation: 0  Physical Activity: Not on File (11/03/2017)   Received from [facility]   Physical Activity    Physical Activity: 0  Stress: Not on File (11/03/2017)   Received from [facility]    Stress    Stress: 0  Social Connections: Unknown (07/25/2021)   Received from [facility]   Social Network    Social Network: Not on file  Intimate Partner Violence: Unknown (06/16/2021)   Received from [facility]   HITS    Physically Hurt: Not on file    Insult or Talk Down To: Not on file    Threaten Physical Harm: Not on file    Scream or Curse: Not on file    ROS      Objective   BP 139/86 (BP Location: Left Arm, Patient Position: Sitting, Cuff Size: Normal)   Pulse 76   Temp 98.1 F (36.7 C) (Oral)   Resp 20   Ht 5' 8 (1.727 m)   Wt 183 lb (83 kg)   LMP 08/21/2023 (Exact Date)   SpO2 99%   BMI 27.83 kg/m    Physical Exam Vitals and nursing note reviewed.  Constitutional:      Appearance: Normal appearance.  HENT:     Head: Normocephalic and atraumatic.  Eyes:     Conjunctiva/sclera: Conjunctivae normal.  Cardiovascular:     Rate and Rhythm: Normal rate and regular rhythm.  Pulmonary:     Effort: Pulmonary effort is normal.     Breath sounds: Normal breath sounds.  Musculoskeletal:     Right lower leg: No edema.     Left lower leg: No edema.  Skin:    General: Skin is warm and dry.  Neurological:     Mental Status: She is alert and oriented to person, place, and time.  Psychiatric:        Mood and Affect: Mood normal.        Behavior: Behavior normal.        Thought Content: Thought content normal.        Judgment: Judgment normal.            The ASCVD Risk score (Arnett DK, et al., 2019) failed to calculate for the following reasons:   The 2019 ASCVD risk score is only valid for ages 7 to 26     Assessment & Plan:  Primary hypertension Assessment & Plan: On amlodipine 5mg  daily with marginal control.  Discussed reducing the salt in her diet to help control her blood pressure.  Information on low salt cooking given.  Please check your blood pressure at home and record.  Please return in two weeks.  If blood pressure control is not  achieved will consider adding low dose hydrochlorothiazide to amlodipine 5mg .    Orders: -     amLODIPine Besylate; Take 1 tablet (5 mg total) by mouth  daily.  Dispense: 90 tablet; Refill: 1  Gastroesophageal reflux disease, unspecified whether esophagitis present Assessment & Plan: Chest pain has resolved since taking omeprazole.  Refilled this for her.    Orders: -     Omeprazole; Take 1 capsule (20 mg total) by mouth daily.  Dispense: 90 capsule; Refill: 1    Return in about 2 weeks (around 09/04/2023) for BP recheck.   Valina Maes K Javayah Magaw, MD

## 2023-08-22 ENCOUNTER — Telehealth: Payer: Self-pay

## 2023-08-22 NOTE — Telephone Encounter (Signed)
 Dr. Ziglar, patient called stating that you were going to prescribe her Gabapentin  for her restless legs. Can you please advise. Thank you.

## 2023-08-23 ENCOUNTER — Other Ambulatory Visit: Payer: Self-pay | Admitting: Family Medicine

## 2023-08-23 DIAGNOSIS — G2581 Restless legs syndrome: Secondary | ICD-10-CM

## 2023-08-23 MED ORDER — GABAPENTIN 300 MG PO CAPS: ORAL_CAPSULE | ORAL | 3 refills | Status: DC

## 2023-09-04 ENCOUNTER — Ambulatory Visit: Admitting: Family Medicine

## 2023-09-05 ENCOUNTER — Encounter: Payer: Self-pay | Admitting: Family Medicine

## 2023-09-05 ENCOUNTER — Ambulatory Visit (INDEPENDENT_AMBULATORY_CARE_PROVIDER_SITE_OTHER): Admitting: Family Medicine

## 2023-09-05 VITALS — BP 138/81 | HR 78 | Temp 98.6°F | Resp 20 | Ht 68.0 in | Wt 185.0 lb

## 2023-09-05 DIAGNOSIS — R300 Dysuria: Secondary | ICD-10-CM

## 2023-09-05 DIAGNOSIS — I10 Essential (primary) hypertension: Secondary | ICD-10-CM | POA: Diagnosis not present

## 2023-09-05 LAB — POCT URINALYSIS DIP (CLINITEK)
Bilirubin, UA: NEGATIVE
Glucose, UA: NEGATIVE mg/dL
Ketones, POC UA: NEGATIVE mg/dL
Leukocytes, UA: NEGATIVE
Nitrite, UA: NEGATIVE
POC PROTEIN,UA: NEGATIVE
Spec Grav, UA: 1.015 (ref 1.010–1.025)
Urobilinogen, UA: 0.2 U/dL
pH, UA: 6.5 (ref 5.0–8.0)

## 2023-09-05 MED ORDER — HYDROCHLOROTHIAZIDE 12.5 MG PO TABS: 12.5000 mg | ORAL_TABLET | Freq: Every day | ORAL | 3 refills | Status: DC

## 2023-09-05 NOTE — Progress Notes (Signed)
 Established Patient Office Visit  Subjective   Patient ID: Karen Gaines, female    DOB: 1985-07-30  Age: 38 y.o. MRN: [id number]  Chief Complaint  Patient presents with   Medical Management of Chronic Issues    HPI Delightful 38 yo woman with HTN, carpal tunnel (right), GERD and RLS, s/p Cesarean section X 2.  Discussed the use of AI scribe software for clinical note transcription with the patient, who gave verbal consent to proceed.  History of Present Illness   Karen Gaines is a 38 year old female with hypertension who presents for blood pressure management.  She has been monitoring her blood pressure and notes that her systolic readings are within normal range, but her diastolic readings are frequently elevated, ranging from 92 to 96 mmHg. She is currently taking amlodipine  5mg  and denies any swelling in her legs.  She reports a burning sensation in her feet last night, which she attributes to wearing shoes all day while attending a class.  She experiences itchiness and cloudy urine but no pain during urination. Her frequency of urination varies with her water intake, which she describes as excessive.  She is not on her period.  She denies vaginal discharge.  She denies abdominal pain and CVA tenderness.     She is concerned about her hair loss and request to go to Dermatology         ROS    Objective:     BP 138/81 (BP Location: Left Arm, Patient Position: Sitting, Cuff Size: Normal)   Pulse 78   Temp 98.6 F (37 C) (Oral)   Resp 20   Ht 5' 8 (1.727 m)   Wt 185 lb (83.9 kg)   LMP 08/21/2023 (Exact Date)   SpO2 98%   BMI 28.13 kg/m    Physical Exam Vitals and nursing note reviewed.  Constitutional:      Appearance: Normal appearance.  HENT:     Head: Normocephalic and atraumatic.   Eyes:     Conjunctiva/sclera: Conjunctivae normal.    Cardiovascular:     Rate and Rhythm: Normal rate and regular rhythm.  Pulmonary:     Effort: Pulmonary effort is  normal.     Breath sounds: Normal breath sounds.  Abdominal:     General: Abdomen is flat. Bowel sounds are normal.     Palpations: Abdomen is soft.     Tenderness: There is no abdominal tenderness.   Musculoskeletal:     Right lower leg: No edema.     Left lower leg: No edema.   Skin:    General: Skin is warm and dry.   Neurological:     Mental Status: She is alert and oriented to person, place, and time.   Psychiatric:        Mood and Affect: Mood normal.        Behavior: Behavior normal.        Thought Content: Thought content normal.        Judgment: Judgment normal.          Results for orders placed or performed in visit on 09/05/23  POCT URINALYSIS DIP (CLINITEK)  Result Value Ref Range   Color, UA yellow yellow   Clarity, UA clear clear   Glucose, UA negative negative mg/dL   Bilirubin, UA negative negative   Ketones, POC UA negative negative mg/dL   Spec Grav, UA 8.984 8.989 - 1.025   Blood, UA trace-lysed (A) negative   pH, UA 6.5 5.0 -  8.0   POC PROTEIN,UA negative negative, trace   Urobilinogen, UA 0.2 0.2 or 1.0 E.U./dL   Nitrite, UA Negative Negative   Leukocytes, UA Negative Negative      The ASCVD Risk score (Arnett DK, et al., 2019) failed to calculate for the following reasons:   The 2019 ASCVD risk score is only valid for ages 86 to 53    Assessment & Plan:  Dysuria -     POCT URINALYSIS DIP (CLINITEK) -     NuSwab Vaginitis Plus (VG+)  Assessment and Plan    Hypertension Diastolic blood pressure remains elevated despite normal systolic readings. Goal: diastolic <80 mmHg. No significant side effects from amlodipine . - Add HCTZ 12.5 mg to current regimen of amlodipine  5mg  daily.    Urinary symptoms Reports itchiness and cloudy urine. Differential includes UTI or irritation from high fluid intake. - Urinalysis:  Leukocytes negative, Nitrate Negative, blood trace lysed.  Specific gravity 1.015.  Had her self collect a vaginal swab for BV  and candidiasis.         Return in about 1 month (around 10/05/2023) for BP recheck.    Ryshawn Sanzone K Frenchie Pribyl, MD

## 2023-09-08 LAB — NUSWAB VAGINITIS PLUS (VG+)
BVAB 2: HIGH {score} — AB
Candida albicans, NAA: POSITIVE — AB
Candida glabrata, NAA: NEGATIVE
Chlamydia trachomatis, NAA: NEGATIVE
Neisseria gonorrhoeae, NAA: NEGATIVE
Trich vag by NAA: NEGATIVE

## 2023-09-09 ENCOUNTER — Other Ambulatory Visit: Payer: Self-pay | Admitting: Family Medicine

## 2023-09-09 ENCOUNTER — Ambulatory Visit: Payer: Self-pay | Admitting: Family Medicine

## 2023-09-09 DIAGNOSIS — N760 Acute vaginitis: Secondary | ICD-10-CM

## 2023-09-16 MED ORDER — FLUCONAZOLE 150 MG PO TABS: 150.0000 mg | ORAL_TABLET | Freq: Once | ORAL | 0 refills | Status: AC

## 2023-10-04 ENCOUNTER — Other Ambulatory Visit (HOSPITAL_COMMUNITY)
Admission: RE | Admit: 2023-10-04 | Discharge: 2023-10-04 | Disposition: A | Discharge status: Home / Self Care | Source: Ambulatory Visit | Attending: Family Medicine | Admitting: Family Medicine

## 2023-10-04 ENCOUNTER — Ambulatory Visit (INDEPENDENT_AMBULATORY_CARE_PROVIDER_SITE_OTHER): Admitting: Family Medicine

## 2023-10-04 ENCOUNTER — Encounter: Payer: Self-pay | Admitting: Family Medicine

## 2023-10-04 VITALS — BP 115/76 | HR 80 | Resp 16 | Ht 68.0 in | Wt 185.0 lb

## 2023-10-04 DIAGNOSIS — Z202 Contact with and (suspected) exposure to infections with a predominantly sexual mode of transmission: Secondary | ICD-10-CM

## 2023-10-04 DIAGNOSIS — Z124 Encounter for screening for malignant neoplasm of cervix: Secondary | ICD-10-CM | POA: Diagnosis not present

## 2023-10-04 DIAGNOSIS — I1A0 Resistant hypertension: Secondary | ICD-10-CM | POA: Diagnosis not present

## 2023-10-04 DIAGNOSIS — Z0000 Encounter for general adult medical examination without abnormal findings: Secondary | ICD-10-CM

## 2023-10-04 NOTE — Assessment & Plan Note (Signed)
 Pap test today with HPV testing.  Normal exam.

## 2023-10-04 NOTE — Assessment & Plan Note (Signed)
 She is asymptomatic but would like to be screened.  Testing for GC, chlamydia, trichomoniasis, HIV and syphilis.

## 2023-10-04 NOTE — Progress Notes (Signed)
 Established Patient Office Visit  Subjective   Patient ID: Karen Gaines, female    DOB: [birth year]  Age: 38 y.o. MRN: [id number]  No chief complaint on file.   HPI Delightful 38 yo woman with HTN, carpal tunnel (right), GERD and RLS, s/p Cesarean section X 2.   She is here for physical exam. She has been taking amlodipine  5 mg and HCTZ 12.5 mg for her blood pressure.  She brought a record of the blood pressure reading she has been getting at home and the vast majority's are in the 120s to 130s over 80s.  She denies ever having an abnormal Pap smear.  Thinks her last Pap was about 5 years ago.  She is not on contraception and does not want to be.  Her periods are monthly and she does not miss time from school or work because of her menstrual cycle.  She would like to be screened for STDs including RPR and HIV.  She denies vaginal discharge, burning with urination or abdominal pain    Objective:     BP 115/76   Pulse 80   Resp 16   Ht 5' 8 (1.727 m)   Wt 185 lb (83.9 kg)   LMP 09/16/2023   BMI 28.13 kg/m    Physical Exam Vitals and nursing note reviewed. Exam conducted with a chaperone present.  Constitutional:      Appearance: Normal appearance.  HENT:     Head: Normocephalic and atraumatic.  Eyes:     Conjunctiva/sclera: Conjunctivae normal.  Cardiovascular:     Rate and Rhythm: Normal rate and regular rhythm.  Pulmonary:     Effort: Pulmonary effort is normal.     Breath sounds: Normal breath sounds.  Chest:     Chest wall: No mass or deformity.  Breasts:    Right: Normal. No mass, nipple discharge or skin change.     Left: Normal. No mass, nipple discharge or skin change.  Genitourinary:    General: Normal vulva.     Exam position: Supine.     Pubic Area: No rash.      Labia:        Right: No rash, lesion or injury.        Left: No rash, lesion or injury.      Vagina: Normal.     Cervix: Normal.     Uterus: Normal.      Adnexa: Right adnexa normal and  left adnexa normal.     Rectum: Normal.  Musculoskeletal:     Right lower leg: No edema.     Left lower leg: No edema.  Lymphadenopathy:     Upper Body:     Right upper body: No supraclavicular, axillary or pectoral adenopathy.     Left upper body: No supraclavicular, axillary or pectoral adenopathy.  Skin:    General: Skin is warm and dry.  Neurological:     Mental Status: She is alert and oriented to person, place, and time.  Psychiatric:        Mood and Affect: Mood normal.        Behavior: Behavior normal.        Thought Content: Thought content normal.        Judgment: Judgment normal.          No results found for any visits on 10/04/23.    The ASCVD Risk score (Arnett DK, et al., 2019) failed to calculate for the following reasons:   The 2019 ASCVD  risk score is only valid for ages 85 to 87    Assessment & Plan:  Pap smear for cervical cancer screening -     Cytology - PAP  Resistant hypertension Assessment & Plan: Blood pressure is well-controlled on amlodipine  5 mg and HCTZ 12.5 mg daily.  Continue this regimen  Orders: -     CBC with Differential/Platelet -     Comprehensive metabolic panel with GFR -     Lipid panel -     TSH -     T4, free  Exposure to sexually transmitted disease (STD) Assessment & Plan: She is asymptomatic but would like to be screened.  Testing for GC, chlamydia, trichomoniasis, HIV and syphilis.  Orders: -     HIV Antibody (routine testing w rflx) -     RPR  Screening for cervical cancer Assessment & Plan: Pap test today with HPV testing.  Normal exam.   Wellness examination Assessment & Plan: Checking CMP, CBC, A1c, thyroid panel and lipids      Return if symptoms worsen or fail to improve.    Angala Hilgers K Roniyah Llorens, MD

## 2023-10-04 NOTE — Assessment & Plan Note (Signed)
 Blood pressure is well-controlled on amlodipine  5 mg and HCTZ 12.5 mg daily.  Continue this regimen

## 2023-10-04 NOTE — Assessment & Plan Note (Signed)
 Checking CMP, CBC, A1c, thyroid panel and lipids

## 2023-10-05 LAB — COMPREHENSIVE METABOLIC PANEL WITH GFR
ALT: 20 IU/L (ref 0–32)
AST: 16 IU/L (ref 0–40)
Albumin: 4.1 g/dL (ref 3.9–4.9)
Alkaline Phosphatase: 82 IU/L (ref 44–121)
BUN/Creatinine Ratio: 16 (ref 9–23)
BUN: 10 mg/dL (ref 6–20)
Bilirubin Total: 0.3 mg/dL (ref 0.0–1.2)
CO2: 23 mmol/L (ref 20–29)
Calcium: 9 mg/dL (ref 8.7–10.2)
Chloride: 97 mmol/L (ref 96–106)
Creatinine, Ser: 0.64 mg/dL (ref 0.57–1.00)
Globulin, Total: 2.4 g/dL (ref 1.5–4.5)
Glucose: 87 mg/dL (ref 70–99)
Potassium: 4 mmol/L (ref 3.5–5.2)
Sodium: 136 mmol/L (ref 134–144)
Total Protein: 6.5 g/dL (ref 6.0–8.5)
eGFR: 115 mL/min/1.73 (ref >59)

## 2023-10-05 LAB — LIPID PANEL
Chol/HDL Ratio: 2.7 ratio (ref 0.0–4.4)
Cholesterol, Total: 131 mg/dL (ref 100–199)
HDL: 48 mg/dL (ref >39)
LDL Chol Calc (NIH): 64 mg/dL (ref 0–99)
Triglycerides: 106 mg/dL (ref 0–149)
VLDL Cholesterol Cal: 19 mg/dL (ref 5–40)

## 2023-10-05 LAB — CBC WITH DIFFERENTIAL/PLATELET
Basophils Absolute: 0 x10E3/uL (ref 0.0–0.2)
Basos: 1 %
EOS (ABSOLUTE): 0.1 x10E3/uL (ref 0.0–0.4)
Eos: 1 %
Hematocrit: 39 % (ref 34.0–46.6)
Hemoglobin: 12.9 g/dL (ref 11.1–15.9)
Immature Grans (Abs): 0.1 x10E3/uL (ref 0.0–0.1)
Immature Granulocytes: 1 %
Lymphocytes Absolute: 1.8 x10E3/uL (ref 0.7–3.1)
Lymphs: 25 %
MCH: 30.3 pg (ref 26.6–33.0)
MCHC: 33.1 g/dL (ref 31.5–35.7)
MCV: 92 fL (ref 79–97)
Monocytes Absolute: 0.8 x10E3/uL (ref 0.1–0.9)
Monocytes: 11 %
Neutrophils Absolute: 4.5 x10E3/uL (ref 1.4–7.0)
Neutrophils: 61 %
Platelets: 297 x10E3/uL (ref 150–450)
RBC: 4.26 x10E6/uL (ref 3.77–5.28)
RDW: 13.1 % (ref 11.7–15.4)
WBC: 7.3 x10E3/uL (ref 3.4–10.8)

## 2023-10-05 LAB — TSH: TSH: 1.01 u[IU]/mL (ref 0.450–4.500)

## 2023-10-05 LAB — RPR: RPR Ser Ql: NONREACTIVE

## 2023-10-05 LAB — HIV ANTIBODY (ROUTINE TESTING W REFLEX): HIV Screen 4th Generation wRfx: NONREACTIVE

## 2023-10-07 ENCOUNTER — Ambulatory Visit: Payer: Self-pay | Admitting: Family Medicine

## 2023-10-15 LAB — CYTOLOGY - PAP
Adequacy: ABSENT
Chlamydia: NEGATIVE
Comment: NEGATIVE
Comment: NEGATIVE
Comment: NEGATIVE
Comment: NORMAL
Diagnosis: REACTIVE
High risk HPV: NEGATIVE
Neisseria Gonorrhea: NEGATIVE
Trichomonas: NEGATIVE

## 2023-12-03 ENCOUNTER — Other Ambulatory Visit: Payer: Self-pay | Admitting: Family Medicine

## 2023-12-03 DIAGNOSIS — I10 Essential (primary) hypertension: Secondary | ICD-10-CM

## 2024-01-01 ENCOUNTER — Ambulatory Visit: Payer: Self-pay

## 2024-01-01 NOTE — Telephone Encounter (Signed)
 Orthostatic 92/56 with dizziness and headache today. Refuses ED and UC. Wants to be seen in office. Patient has been having low blood pressures for the past couple of weeks. Advised nurse that clinic will reach out to patient to schedule an office visit for tomorrow.

## 2024-01-01 NOTE — Telephone Encounter (Signed)
 FYI Only or Action Required?: Action required by provider: Please contact patient with work in appt.  Patient was last seen in primary care on 10/04/2023 by Ziglar, Susan K, MD.  Called Nurse Triage reporting Hypotension.  Symptoms began several weeks ago.  Interventions attempted: Rest, hydration, or home remedies.  Symptoms are: gradually worsening.  Triage Disposition: No disposition on file.  Patient/caregiver understands and will follow disposition?:   Copied from CRM [id number]. Topic: Clinical - Red Word Triage >> Jan 01, 2024  2:55 PM Lonell PEDLAR wrote: Red Word that prompted transfer to Nurse Triage: patient has low blood pressure, with dizziness and headaches when standing up or walking. Reason for Disposition  [1] Systolic BP 90-110 AND [2] taking blood pressure medications AND [3] feeling weak or lightheaded  Answer Assessment - Initial Assessment Questions For the last few weeks patient has been having episodes of dizziness, headache when going from sitting to standing. Episodes happening at least twice a day. She checked her BP after standing today 92/56. Lay down with feet up, and drank water and BP rose to 161/86. HR stable in the 70s. No CP, SOB. States she does get GERD and takes meds for that.   She has been on Zoom classes most of the day and had 3 water bottles prior to standing up and having the low BP. Does not feel dehydrated. Denies any recent illness. She was hoping they would go away by now but knows she needs to be evaluated.   Hydrochlorothiazide  has been out for 2 weeks- when she requested Refill It was never sent or prepared for her to pick up.  Advised OV within 24hrs. She only wants to see Dr Ziglar for this. CAL contacted to see about work in appt for [employer] 10/23. Office to contact patient once they have the ok. ED/UC precautions given and understood.   1. BLOOD PRESSURE: What is your blood pressure? Did you take at least two measurements 5 minutes  apart?     92/56 2. ONSET: When did you take your blood pressure?     A couple weeks 3. HOW: How did you take your blood pressure? (e.g., visiting nurse, automatic home BP monitor)    Wrist cuff - usually correlates with office BPs 4. HISTORY: Do you have a history of low blood pressure? What is your blood pressure normally?     HTN usually 5. MEDICINES: Are you taking any medicines for blood pressure? If Yes, ask: Have they been changed recently?     Amlodipine - reports compliance but did have one day she took later then normal  6. PULSE RATE: Do you know what your pulse rate is?      71 when she checked BP  7. OTHER SYMPTOMS: Have you been sick recently? Have you had a recent injury?     Denies  8. PREGNANCY: Is there any chance you are pregnant? When was your last menstrual period?     Denies  Protocols used: Blood Pressure - Low-A-AH

## 2024-01-02 ENCOUNTER — Ambulatory Visit (INDEPENDENT_AMBULATORY_CARE_PROVIDER_SITE_OTHER): Admitting: Family Medicine

## 2024-01-02 VITALS — BP 143/84 | HR 67 | Temp 98.4°F | Resp 18 | Ht 68.0 in | Wt 185.0 lb

## 2024-01-02 DIAGNOSIS — I10 Essential (primary) hypertension: Secondary | ICD-10-CM | POA: Diagnosis not present

## 2024-01-02 DIAGNOSIS — I951 Orthostatic hypotension: Secondary | ICD-10-CM | POA: Diagnosis not present

## 2024-01-02 DIAGNOSIS — E860 Dehydration: Secondary | ICD-10-CM | POA: Diagnosis not present

## 2024-01-02 LAB — POCT URINALYSIS DIP (CLINITEK)
Bilirubin, UA: NEGATIVE
Glucose, UA: NEGATIVE mg/dL
Ketones, POC UA: NEGATIVE mg/dL
Leukocytes, UA: NEGATIVE
Nitrite, UA: NEGATIVE
POC PROTEIN,UA: 30 — AB
Spec Grav, UA: 1.015 (ref 1.010–1.025)
Urobilinogen, UA: 0.2 U/dL
pH, UA: 7 (ref 5.0–8.0)

## 2024-01-02 NOTE — Progress Notes (Signed)
 Established Patient Office Visit  Subjective   Patient ID: Karen Gaines, female    DOB: 11-Aug-1985  Age: 38 y.o. MRN: [id number]  Chief Complaint  Patient presents with   Medical Management of Chronic Issues    HPI  Karen Gaines is a 38 year old female who presents with symptoms of dehydration and orthostatic hypotension.  She experiences dizziness and near-fainting episodes, particularly upon standing. Her seated blood pressure was 143/84 mmHg with a heart rate of 67 bpm, and her standing blood pressure was 121/83 mmHg with a heart rate of 82 bpm. She describes an episode where she almost fell after standing up from a seated position, which led her to check her blood pressure.  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 This schedule leaves limited time for rest and self-care. She is also responsible for taking care of her children.  She is currently taking amlodipine  5 mg and omecetaxine for reflux. She was previously prescribed hydrocortisone but did not have it filled. Despite drinking a lot of water, she still feels dehydrated.  No diarrhea or recent changes in her health other than the symptoms described. Discussed the use of AI scribe software for clinical note transcription with the patient, who gave verbal consent to proceed.  History of Present Illness   Karen Gaines is a 38 year old female who presents with symptoms of dehydration and orthostatic hypotension.  She experiences dizziness and near-fainting episodes, particularly upon standing. Her seated blood pressure was 143/84 mmHg with a heart rate of 67 bpm, and her standing blood pressure was 121/83 mmHg with a heart rate of 82 bpm. She describes an episode where she almost fell after standing up from a seated position, which led her to check her blood pressure.  She denies diarrhea, vomiting or decreased fluid  intake.  She drinks 5 or 6 bottles of water a day and has done this for the last few days.  She is taking amlodipine  5 mg daily for her hypertension.  She does not take HCTZ any longer.  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 This schedule leaves limited time for rest and self-care. She is also responsible for taking care of her children.  She is currently taking amlodipine  5 mg and omeprazole  for reflux. She was previously prescribed hydrochlorothiazide  but did not have it filled. Despite drinking a lot of water, she still feels dehydrated.  No diarrhea or recent changes in her health other than the symptoms described.           Objective:     BP (!) 143/84 (BP Location: Left Arm, Patient Position: Sitting, Cuff Size: Normal)   Pulse 67   Temp 98.4 F (36.9 C) (Oral)   Resp 18   Ht 5' 8 (1.727 m)   Wt 185 lb (83.9 kg)   SpO2 99%   BMI 28.13 kg/m    Physical Exam Vitals and nursing note reviewed.  Constitutional:      Appearance: Normal appearance.  HENT:     Head: Normocephalic and atraumatic.  Eyes:     Conjunctiva/sclera: Conjunctivae normal.  Cardiovascular:     Rate and Rhythm: Normal rate and regular rhythm.  Pulmonary:     Effort: Pulmonary effort is normal.     Breath  sounds: Normal breath sounds.  Musculoskeletal:     Right lower leg: No edema.     Left lower leg: No edema.  Skin:    General: Skin is warm and dry.  Neurological:     Mental Status: She is alert and oriented to person, place, and time.  Psychiatric:        Mood and Affect: Mood normal.        Behavior: Behavior normal.        Thought Content: Thought content normal.        Judgment: Judgment normal.          Results for orders placed or performed in visit on 01/02/24  POCT URINALYSIS DIP (CLINITEK)  Result Value Ref Range   Color, UA red (A) yellow   Clarity, UA  cloudy (A) clear   Glucose, UA negative negative mg/dL   Bilirubin, UA negative negative   Ketones, POC UA negative negative mg/dL   Spec Grav, UA 8.984 8.989 - 1.025   Blood, UA large (A) negative   pH, UA 7.0 5.0 - 8.0   POC PROTEIN,UA =30 (A) negative, trace   Urobilinogen, UA 0.2 0.2 or 1.0 E.U./dL   Nitrite, UA Negative Negative   Leukocytes, UA Negative Negative      The ASCVD Risk score (Arnett DK, et al., 2019) failed to calculate for the following reasons:   The 2019 ASCVD risk score is only valid for ages 82 to 17    Assessment & Plan:  Dehydration -     POCT URINALYSIS DIP (CLINITEK)  Primary hypertension -     CBC with Differential/Platelet -     Comprehensive metabolic panel with GFR -     Lipid panel -     TSH -     T4, free  Orthostatic hypotension Assessment & Plan: She tested positive for orthostatic hypotension today.  She has not been taking her HCTZ just amlodipine  5 mg for her hypertension.  She denies  vomiting or diarrhea.  UA shows a specific gravity of 1.015 so she is getting adequate fluids.  Stop taking amlodipine  and watch her blood pressure.  Checking CBC for anemia, CMP for renal function, thyroid function.  Follow-up next week so we can check your blood pressure.      Return in about 1 week (around 01/09/2024).    Aviraj Kentner K Aseem Sessums, MD

## 2024-01-02 NOTE — Assessment & Plan Note (Signed)
 She tested positive for orthostatic hypotension today.  She has not been taking her HCTZ just amlodipine  5 mg for her hypertension.  She denies  vomiting or diarrhea.  UA shows a specific gravity of 1.015 so she is getting adequate fluids.  Stop taking amlodipine  and watch her blood pressure.  Checking CBC for anemia, CMP for renal function, thyroid function.  Follow-up next week so we can check your blood pressure.

## 2024-01-03 ENCOUNTER — Ambulatory Visit: Payer: Self-pay | Admitting: Family Medicine

## 2024-01-03 LAB — CBC WITH DIFFERENTIAL/PLATELET
Basophils Absolute: 0 x10E3/uL (ref 0.0–0.2)
Basos: 0 %
EOS (ABSOLUTE): 0.1 x10E3/uL (ref 0.0–0.4)
Eos: 1 %
Hematocrit: 40.1 % (ref 34.0–46.6)
Hemoglobin: 13.6 g/dL (ref 11.1–15.9)
Immature Grans (Abs): 0 x10E3/uL (ref 0.0–0.1)
Immature Granulocytes: 0 %
Lymphocytes Absolute: 1.4 x10E3/uL (ref 0.7–3.1)
Lymphs: 28 %
MCH: 31.2 pg (ref 26.6–33.0)
MCHC: 33.9 g/dL (ref 31.5–35.7)
MCV: 92 fL (ref 79–97)
Monocytes Absolute: 0.5 x10E3/uL (ref 0.1–0.9)
Monocytes: 10 %
Neutrophils Absolute: 3.1 x10E3/uL (ref 1.4–7.0)
Neutrophils: 60 %
Platelets: 333 x10E3/uL (ref 150–450)
RBC: 4.36 x10E6/uL (ref 3.77–5.28)
RDW: 13 % (ref 11.7–15.4)
WBC: 5.1 x10E3/uL (ref 3.4–10.8)

## 2024-01-03 LAB — COMPREHENSIVE METABOLIC PANEL WITH GFR
ALT: 10 IU/L (ref 0–32)
AST: 11 IU/L (ref 0–40)
Albumin: 4.2 g/dL (ref 3.9–4.9)
Alkaline Phosphatase: 74 IU/L (ref 41–116)
BUN/Creatinine Ratio: 10 (ref 9–23)
BUN: 7 mg/dL (ref 6–20)
Bilirubin Total: 0.3 mg/dL (ref 0.0–1.2)
CO2: 22 mmol/L (ref 20–29)
Calcium: 9 mg/dL (ref 8.7–10.2)
Chloride: 102 mmol/L (ref 96–106)
Creatinine, Ser: 0.7 mg/dL (ref 0.57–1.00)
Globulin, Total: 2.6 g/dL (ref 1.5–4.5)
Glucose: 96 mg/dL (ref 70–99)
Potassium: 3.3 mmol/L — ABNORMAL LOW (ref 3.5–5.2)
Sodium: 139 mmol/L (ref 134–144)
Total Protein: 6.8 g/dL (ref 6.0–8.5)
eGFR: 113 mL/min/1.73 (ref >59)

## 2024-01-03 LAB — LIPID PANEL
Chol/HDL Ratio: 3.3 ratio (ref 0.0–4.4)
Cholesterol, Total: 128 mg/dL (ref 100–199)
HDL: 39 mg/dL — ABNORMAL LOW (ref >39)
LDL Chol Calc (NIH): 65 mg/dL (ref 0–99)
Triglycerides: 137 mg/dL (ref 0–149)
VLDL Cholesterol Cal: 24 mg/dL (ref 5–40)

## 2024-01-03 LAB — TSH: TSH: 1.36 u[IU]/mL (ref 0.450–4.500)

## 2024-01-03 LAB — T4, FREE: Free T4: 1.08 ng/dL (ref 0.82–1.77)

## 2024-01-07 ENCOUNTER — Ambulatory Visit: Admitting: Family Medicine

## 2024-01-09 ENCOUNTER — Ambulatory Visit (INDEPENDENT_AMBULATORY_CARE_PROVIDER_SITE_OTHER): Admitting: Family Medicine

## 2024-01-09 ENCOUNTER — Encounter: Payer: Self-pay | Admitting: Family Medicine

## 2024-01-09 VITALS — BP 128/85 | HR 73 | Temp 98.3°F | Resp 18 | Ht 68.0 in | Wt 187.0 lb

## 2024-01-09 DIAGNOSIS — I10 Essential (primary) hypertension: Secondary | ICD-10-CM

## 2024-01-09 DIAGNOSIS — G44209 Tension-type headache, unspecified, not intractable: Secondary | ICD-10-CM

## 2024-01-09 DIAGNOSIS — R519 Headache, unspecified: Secondary | ICD-10-CM | POA: Insufficient documentation

## 2024-01-09 MED ORDER — BUTALBITAL-APAP-CAFFEINE 50-325-40 MG PO TABS: 1.0000 | ORAL_TABLET | Freq: Four times a day (QID) | ORAL | 0 refills | Status: DC | PRN

## 2024-01-09 NOTE — Progress Notes (Signed)
 Established Patient Office Visit  Subjective   Patient ID: Karen Gaines, female    DOB: 10-27-1985  Age: 38 y.o. MRN: [id number]  Chief Complaint  Patient presents with   Medical Management of Chronic Issues    HPI Karen Gaines 38 year old female who has GERD, carpal tunnel syndrome, HTN, who recently had orthostatic hypotension likely from dehydration.    Discussed the use of AI scribe software for clinical note transcription with the patient, who gave verbal consent to proceed.  History of Present Illness   Karen Gaines is a 38 year old female who presents with a severe headache.  She has a severe headache that began after consuming a mixture of crushed garlic and clove this morning. She did not take any medication for the headache prior to the visit.  She regularly monitors her blood pressure, which has been stable with recent readings of 130/79 while sitting and 126/78 while standing. She notes a slight increase in heart rate upon standing. She mentions that her morning routine may lead to inaccurate blood pressure readings due to insufficient time between measurements.  She is balancing school, work, and family responsibilities, which she finds exhausting. She acknowledges that being off work or school can disrupt her routine, leading to increased stress and neglecting self-care.  She received a flu vaccine on October 10th, as required by her school. She drinks water regularly.         Objective:     BP 128/85 (BP Location: Left Arm, Patient Position: Sitting, Cuff Size: Normal)   Pulse 73   Temp 98.3 F (36.8 C) (Oral)   Resp 18   Ht 5' 8 (1.727 m)   Wt 187 lb (84.8 kg)   SpO2 97%   BMI 28.43 kg/m    Physical Exam Vitals reviewed.  Constitutional:      Appearance: Normal appearance.  HENT:     Head: Normocephalic.  Eyes:     General:        Right eye: No discharge.        Left eye: No discharge.  Cardiovascular:     Rate and Rhythm: Normal rate.   Pulmonary:     Effort: Pulmonary effort is normal.  Neurological:     Mental Status: She is alert and oriented to person, place, and time.  Psychiatric:        Mood and Affect: Mood normal.        Behavior: Behavior normal.        Thought Content: Thought content normal.        Judgment: Judgment normal.          No results found for any visits on 01/09/24.    The ASCVD Risk score (Arnett DK, et al., 2019) failed to calculate for the following reasons:   The 2019 ASCVD risk score is only valid for ages 52 to 66    Assessment & Plan:  Acute non intractable tension-type headache Assessment & Plan: She has a pretty bad headache today.  She has no medicine to take.  The headache may be stress related or tension headache due to her busy schedule.  Will trial Fioricet.  Please drink plenty of water.   Acute non intractable tension-type headache Assessment & Plan: She has a pretty bad headache today.  She has no medicine to take.  The headache may be stress related or tension headache due to her busy schedule.  Will trial Fioricet.  Please drink plenty of water.  Orders: -     Butalbital-APAP-Caffeine; Take 1 tablet by mouth every 6 (six) hours as needed for headache.  Dispense: 14 tablet; Refill: 0  Primary hypertension Assessment & Plan: Blood pressure is well-controlled with amlodipine  5 mg daily.  She has some orthostatic readings but it may be that she did not wait long enough between the readings to get an accurate heart rate.  Please work on drinking adequate water daily.               Return if symptoms worsen or fail to improve.    Jahmire Ruffins K Laura Radilla, MD

## 2024-01-09 NOTE — Assessment & Plan Note (Signed)
 She has a pretty bad headache today.  She has no medicine to take.  The headache may be stress related or tension headache due to her busy schedule.  Will trial Fioricet.  Please drink plenty of water.

## 2024-01-09 NOTE — Assessment & Plan Note (Signed)
 Blood pressure is well-controlled with amlodipine  5 mg daily.  She has some orthostatic readings but it may be that she did not wait long enough between the readings to get an accurate heart rate.  Please work on drinking adequate water daily.

## 2024-01-10 ENCOUNTER — Other Ambulatory Visit: Payer: Self-pay

## 2024-01-10 ENCOUNTER — Other Ambulatory Visit: Payer: Self-pay | Admitting: Family Medicine

## 2024-01-10 ENCOUNTER — Telehealth: Payer: Self-pay

## 2024-01-10 DIAGNOSIS — G44209 Tension-type headache, unspecified, not intractable: Secondary | ICD-10-CM

## 2024-01-10 MED ORDER — BUTALBITAL-APAP-CAFFEINE 50-325-40 MG PO TABS: 1.0000 | ORAL_TABLET | Freq: Four times a day (QID) | ORAL | 0 refills | Status: DC | PRN

## 2024-01-10 NOTE — Progress Notes (Signed)
 This is an error.

## 2024-01-10 NOTE — Telephone Encounter (Signed)
 Copied from CRM #[id number]. Topic: Clinical - Prescription Issue >> Jan 09, 2024  2:45 PM Rosaria BRAVO wrote: Reason for CRM: Pt called reporting that she discussed receiving a medication for a headache and muscle relaxer during today's visit. Nothing at the pharmacy, Please advise.   [id number] [address], [place] - [address] [place] [address] Phone: [phone] Fax: [phone]  Best contact: [phone]

## 2024-03-11 ENCOUNTER — Ambulatory Visit: Admitting: Physician Assistant

## 2024-03-11 ENCOUNTER — Encounter: Payer: Self-pay | Admitting: Physician Assistant

## 2024-03-11 ENCOUNTER — Ambulatory Visit: Payer: Self-pay

## 2024-03-11 VITALS — BP 160/98 | HR 88 | Ht 68.0 in | Wt 188.0 lb

## 2024-03-11 DIAGNOSIS — I10 Essential (primary) hypertension: Secondary | ICD-10-CM | POA: Diagnosis not present

## 2024-03-11 DIAGNOSIS — G5793 Unspecified mononeuropathy of bilateral lower limbs: Secondary | ICD-10-CM | POA: Diagnosis not present

## 2024-03-11 DIAGNOSIS — G2581 Restless legs syndrome: Secondary | ICD-10-CM | POA: Diagnosis not present

## 2024-03-11 MED ORDER — GABAPENTIN 300 MG PO CAPS: ORAL_CAPSULE | ORAL | 1 refills | Status: AC

## 2024-03-11 NOTE — Telephone Encounter (Signed)
 FYI Only or Action Required?: FYI only for provider: Going to mobile medicine clinic.  Patient was last seen in primary care on 01/09/2024 by Ziglar, Devere POUR, MD.  Called Nurse Triage reporting Foot Pain.  Symptoms began several days ago.  Interventions attempted: Prescription medications: Dilaudid and Other: Soaking feet in cool water.  Symptoms are: unchanged.  Triage Disposition: See HCP Within 4 Hours (Or PCP Triage)  Patient/caregiver understands and will follow disposition?: Yes Reason for Disposition  [1] SEVERE pain (e.g., excruciating, unable to do any normal activities) AND [2] not improved after 2 hours of pain medicine  Answer Assessment - Initial Assessment Questions Soaked feet in cold water, by Sunday burning so bad could not go to work. Went to [facility] on Sunday, checked vitals BP was 176/102 and prescribed Dilaudid and sent her home. Getting no relief from Dilaudid. No available appointments in PCP office within dispo, advised mobile medicine clinic today, given patient location and information.   1. ONSET: When did the pain start?      5 days ago  2. LOCATION: Where is the pain located?      Both feet on the bottom and feeling it on the top today  3. PAIN: How bad is the pain?    (Scale 1-10; or mild, moderate, severe)     Feels hot and like on fire 10/10  4. WORK OR EXERCISE: Has there been any recent work or exercise that involved this part of the body?      Denies  5. CAUSE: What do you think is causing the foot pain?     Unsure  6. OTHER SYMPTOMS: Do you have any other symptoms? (e.g., leg pain, rash, fever, numbness)     Bilateral knee pain, not new.  Protocols used: Foot Pain-A-AH  Copied from CRM [id number]. Topic: Clinical - Red Word Triage >> Mar 11, 2024  9:06 AM Avram MATSU wrote: Red Word that prompted transfer to Nurse Triage: burning of her feet that's not getting better, pain and she has to soak her feet a lot.     ----------------------------------------------------------------------- From previous Reason for Contact - Scheduling: Patient/patient representative is calling to schedule an appointment. Refer to attachments for appointment information.

## 2024-03-11 NOTE — Progress Notes (Signed)
 "  New Patient Office Visit  Subjective    Patient ID: Karen Gaines, female    DOB: [birth year]  Age: 38 y.o. MRN: [id number]  CC:  Chief Complaint  Patient presents with   Foot Burn    She reports her feet are burning on the bottom since last friday  Discussed the use of AI scribe software for clinical note transcription with the patient, who gave verbal consent to proceed.  History of Present Illness   Karen Gaines is a 38 year old female who presents with burning pain in both feet.  She noticed new burning sensations in both feet starting last Friday, different from her usual leg pain and without prior similar episodes. Pain began about two days after she ran out of gabapentin , which she had taken for leg pain, and resting has not helped. She has no known neuropathy or diabetes. She was given Dilaudid at urgent care on Sunday, which has not relieved the burning pain. She previously took blood pressure medication that was stopped. Her blood pressure today is 160/98, with a prior elevated reading of 143/84 in October, and she has not been checking her blood pressure at home since stopping the medication.   Outpatient Encounter Medications as of 03/11/2024  Medication Sig   butalbital -acetaminophen -caffeine  (FIORICET) 50-325-40 MG tablet Take 1 tablet by mouth every 6 (six) hours as needed for headache.   omeprazole  (PRILOSEC) 20 MG capsule Take 1 capsule (20 mg total) by mouth daily.   amLODipine  (NORVASC ) 5 MG tablet Take 1 tablet (5 mg total) by mouth daily. (Patient not taking: Reported on 03/11/2024)   butalbital -acetaminophen -caffeine  (FIORICET) 50-325-40 MG tablet Take 1 tablet by mouth every 6 (six) hours as needed for headache.   gabapentin  (NEURONTIN ) 300 MG capsule Take 1 po about 5 pm for restless leg syndrome.   hydrochlorothiazide  (HYDRODIURIL ) 12.5 MG tablet Take 1 tablet (12.5 mg total) by mouth daily. (Patient not taking: Reported on 03/11/2024)   Meth-Hyo-M Bl-Na Phos-Ph  Sal (URIBEL) 118 MG CAPS TAKE 1 CAPSULE BY MOUTH EVERY 8 HOURS AS NEEDED (Patient not taking: Reported on 03/11/2024)   trimethoprim (TRIMPEX) 100 MG tablet Take 100 mg by mouth 2 (two) times daily. (Patient not taking: Reported on 03/11/2024)   [DISCONTINUED] gabapentin  (NEURONTIN ) 300 MG capsule Take 1 po about 5 pm for restless leg syndrome. (Patient not taking: Reported on 03/11/2024)   No facility-administered encounter medications on file as of 03/11/2024.    Past Medical History:  Diagnosis Date   Complication of anesthesia    HAD EMERGENCY SECTION AND SAID SHE WAS NOT NUMB AT ALL AND FELT EVERYTHING   GERD (gastroesophageal reflux disease)    RARE-NO MEDS   Hypertension     Past Surgical History:  Procedure Laterality Date   CESAREAN SECTION     X 2   HEMORRHOID SURGERY N/A 05/09/2018   Procedure: HEMORRHOIDECTOMY AND SKIN TAG REMOVAL;  Surgeon: Tye Millet, DO;  Location: [facility] ORS;  Service: General;  Laterality: N/A;    History reviewed. No pertinent family history.  Social History   Socioeconomic History   Marital status: Single    Spouse name: Not on file   Number of children: Not on file   Years of education: Not on file   Highest education level: Not on file  Occupational History   Not on file  Tobacco Use   Smoking status: Never    Passive exposure: Never   Smokeless tobacco: Never  Vaping Use   Vaping  status: Never Used  Substance and Sexual Activity   Alcohol use: Never   Drug use: Never   Sexual activity: Not on file  Other Topics Concern   Not on file  Social History Narrative   Not on file   Social Drivers of Health   Tobacco Use: Low Risk (03/11/2024)   Patient History    Smoking Tobacco Use: Never    Smokeless Tobacco Use: Never    Passive Exposure: Never  Financial Resource Strain: Not on file  Food Insecurity: No Food Insecurity (03/11/2024)   Epic    Worried About Programme Researcher, Broadcasting/film/video in the Last Year: Never true    Ran Out of Food  in the Last Year: Never true  Transportation Needs: No Transportation Needs (03/11/2024)   Epic    Lack of Transportation (Medical): No    Lack of Transportation (Non-Medical): No  Physical Activity: Not on file  Stress: Not on file  Social Connections: Unknown (07/25/2021)   Received from [facility]   Social Network    Social Network: Not on file  Intimate Partner Violence: Not At Risk (03/11/2024)   Epic    Fear of Current or Ex-Partner: No    Emotionally Abused: No    Physically Abused: No    Sexually Abused: No  Depression (PHQ2-9): Low Risk (03/11/2024)   Depression (PHQ2-9)    PHQ-2 Score: 2  Alcohol Screen: Not on file  Housing: Low Risk (03/11/2024)   Epic    Unable to Pay for Housing in the Last Year: No    Number of Times Moved in the Last Year: 0    Homeless in the Last Year: No  Utilities: Not At Risk (03/11/2024)   Epic    Threatened with loss of utilities: No  Health Literacy: Not on file    Review of Systems  Constitutional: Negative.   HENT: Negative.    Eyes: Negative.   Respiratory:  Negative for shortness of breath.   Cardiovascular:  Negative for chest pain.  Gastrointestinal: Negative.   Genitourinary: Negative.   Musculoskeletal: Negative.   Skin: Negative.   Neurological: Negative.   Endo/Heme/Allergies: Negative.   Psychiatric/Behavioral: Negative.          Objective    BP (!) 160/98 (BP Location: Left Arm, Patient Position: Sitting)   Pulse 88   Ht 5' 8 (1.727 m)   Wt 188 lb (85.3 kg)   SpO2 98%   BMI 28.59 kg/m   Physical Exam Vitals and nursing note reviewed.  Constitutional:      Appearance: Normal appearance.  HENT:     Head: Normocephalic and atraumatic.     Right Ear: External ear normal.     Left Ear: External ear normal.     Nose: Nose normal.     Mouth/Throat:     Mouth: Mucous membranes are moist.     Pharynx: Oropharynx is clear.  Eyes:     Extraocular Movements: Extraocular movements intact.      Conjunctiva/sclera: Conjunctivae normal.     Pupils: Pupils are equal, round, and reactive to light.  Cardiovascular:     Rate and Rhythm: Normal rate and regular rhythm.     Pulses: Normal pulses.          Dorsalis pedis pulses are 2+ on the right side and 2+ on the left side.       Posterior tibial pulses are 2+ on the right side and 2+ on the left side.  Heart sounds: Normal heart sounds.  Pulmonary:     Effort: Pulmonary effort is normal.     Breath sounds: Normal breath sounds.  Musculoskeletal:        General: Normal range of motion.     Cervical back: Normal range of motion and neck supple.     Right lower leg: No edema.     Left lower leg: No edema.  Skin:    General: Skin is warm and dry.  Neurological:     General: No focal deficit present.     Mental Status: She is alert and oriented to person, place, and time.  Psychiatric:        Mood and Affect: Mood normal.        Behavior: Behavior normal.        Thought Content: Thought content normal.        Judgment: Judgment normal.         Assessment & Plan:   Problem List Items Addressed This Visit   None Visit Diagnoses       Neuropathy of both feet    -  Primary   Relevant Medications   gabapentin  (NEURONTIN ) 300 MG capsule     Restless leg syndrome       Relevant Medications   gabapentin  (NEURONTIN ) 300 MG capsule     Elevated blood pressure reading in office with diagnosis of hypertension            Assessment and Plan Restless legs syndrome Burning sensation in both feet likely due to gabapentin  discontinuation. Differential includes neuropathy or restless legs syndrome. - Restart gabapentin  immediately. - Discontinue Dilaudid. - Follow up with primary care provider to evaluate or return to [facility] as needed    Hypertension Blood pressure elevated at 160/98, possibly due to pain. Previous readings were normal. - Monitor blood pressure daily and keep a log. - Schedule appointment with primary care  provider to discuss management and potential need for medication.  The patient was given clear instructions to go to ER or return to medical center if symptoms don't improve, worsen or new problems develop. The patient verbalized understanding.     I have reviewed the patient's medical history (PMH, PSH, Social History, Family History, Medications, and allergies) , and have been updated if relevant. I spent 30 minutes reviewing chart and  face to face time with patient.   Return if symptoms worsen or fail to improve.   Kirk RAMAN Mayers, PA-C   "

## 2024-03-11 NOTE — Patient Instructions (Addendum)
 VISIT SUMMARY:  Today, you came in with burning pain in both feet that started after you ran out of gabapentin . You also had an elevated blood pressure reading.  YOUR PLAN:  -RESTLESS LEGS SYNDROME: The burning sensation in your feet is likely due to stopping gabapentin , which you were taking for leg pain. Restless legs syndrome is a condition that causes an uncontrollable urge to move your legs, usually due to uncomfortable sensations. You should restart gabapentin  immediately and stop taking Dilaudid. Follow up with your primary care provider to check for neuropathy or other causes.   -HYPERTENSION: Your blood pressure was elevated at 160/98 today, which could be due to pain. Hypertension is high blood pressure, which can lead to serious health issues if not managed. You should monitor your blood pressure daily and keep a log of the readings. Schedule an appointment with your primary care provider to discuss management and whether you need to restart medication.   Nerve Pain (Neuropathic Pain): What to Know Nerve pain, also called neuropathic pain, happens when nerves in your body are damaged. This type of pain can make you feel more pain than usual. Even a small touch can hurt a lot. Nerve pain can last for a long time (be chronic) and can be hard to treat. The pain can be different for each person. It might: Start suddenly or slowly. Come and go as the damaged nerves heal, or it may stay the same for years. Cause stress, trouble sleeping, and make life harder. What are the causes? Many things can cause nerve damage, such as: Metabolic problems like: Diabetes. This is the most common cause. Lack of vitamins like B12. Medicines and chemicals. Nerve damage can happen from medicines that kill cancer cells (chemotherapy) or from drinking too much alcohol. Any injury that cuts, crushes, or stretches a nerve. Compression. If a nerve gets trapped or compressed for too long, the blood supply to  the nerve can be cut off. Blood vessel disease. This can cause pain by decreasing blood supply and oxygen to nerves. Autoimmune diseases like lupus or multiple sclerosis. These are diseases where the body's defense system (immune system) attacks its own nerves. Infections with germs, also called viruses, such as shingles. Diseases that are passed down through families. What increases the risk? You're more likely to get nerve pain if: You have diabetes. You smoke. You drink too much alcohol. You take certain medicines, like those for cancer or immune system problems. What are the signs or symptoms? The main symptom is pain. Nerve pain is often described as: Burning. Shock-like. Stinging. Hot or cold. Itching. Tingling. Prickling. How is this diagnosed? No single test can diagnose nerve pain. It's diagnosed based on: A physical exam and your symptoms. Your health care provider will ask you about your pain. You may be asked to use a pain scale to describe how bad your pain is. Tests to find nerve damage, like: Nerve conduction studies and EMG to check how well nerves and muscles work. Skin biopsy, which is when a small piece of skin is removed for testing. This test looks for a nerve problem called small fiber neuropathy. Imaging tests, such as: X-rays. CT scan. MRI. How is this treated? Treatment can change over time. You might need to try different treatments or a mix of them. Options include: Treating the cause such as managing diabetes or fixing vitamin levels. Stopping medicines that can cause nerve pain. Taking medicines to relieve pain. These may include: Pain medicines. Anti-seizure  medicines. Antidepressant medicines. Pain-relieving patches or creams that are put on painful areas of skin. A medicine to numb the area, which can be injected as a nerve block. Transcutaneous nerve stimulation. This uses electrical currents to block painful nerve signals. The treatment is  painless. Other treatments, such as: Acupuncture. Meditation. Massage. Occupational or physical therapy. Pain management programs. Counseling. Follow these instructions at home: Medicines  Take your medicines only as told. You may need to take steps to help treat or prevent trouble pooping (constipation), such as: Taking medicines to help you poop. Eating foods high in fiber, like beans, whole grains, and fresh fruits and vegetables. Drinking more fluids as told. Ask your provider if it's safe to drive or use machines while taking your medicine. Lifestyle  Have a good support system at home. Join a chronic pain support group. Consider talking with a mental health care provider about how to cope with the pain. Do not smoke, vape, or use nicotine or tobacco. Do not drink alcohol. General instructions Learn as much as you can about your condition. Work closely with all your providers to find the treatment plan that works best for you. Ask what things are safe for you to do at home. Exercise as told. Keep all follow-up visits. Your provider will check if the treatments are working and change them if needed. Contact a health care provider if: Your pain treatments aren't working. You're having side effects from your medicines. You feel very tired, sad, or anxious. Get help right away if: You feel like you may hurt yourself or others. You have thoughts about taking your own life. You have other thoughts or feelings that worry you. These symptoms may be an emergency. Take one of these steps right away: Go to your nearest emergency room. Call 911. Contact the Suicide & Crisis Lifeline (24/7, free and confidential): Call or text 988. Chat online at chat.newsactor.se. For Veterans and their loved ones: Call 988 and press 1. Text the [employer] at 425-873-6923. Chat online at reservationslist.si. This information is not intended to replace advice given to you by your  health care provider. Make sure you discuss any questions you have with your health care provider. Document Revised: 05/15/2023 Document Reviewed: 05/15/2023 Elsevier Patient Education  2025 [employer].

## 2024-03-16 ENCOUNTER — Ambulatory Visit: Admitting: Family Medicine

## 2024-03-16 ENCOUNTER — Telehealth: Payer: Self-pay

## 2024-03-16 ENCOUNTER — Encounter: Payer: Self-pay | Admitting: Family Medicine

## 2024-03-16 VITALS — BP 160/89 | HR 64 | Ht 68.0 in | Wt 187.6 lb

## 2024-03-16 DIAGNOSIS — G44209 Tension-type headache, unspecified, not intractable: Secondary | ICD-10-CM | POA: Diagnosis not present

## 2024-03-16 DIAGNOSIS — G6289 Other specified polyneuropathies: Secondary | ICD-10-CM

## 2024-03-16 DIAGNOSIS — I10 Essential (primary) hypertension: Secondary | ICD-10-CM | POA: Diagnosis not present

## 2024-03-16 MED ORDER — BUTALBITAL-APAP-CAFFEINE 50-325-40 MG PO TABS: 1.0000 | ORAL_TABLET | Freq: Four times a day (QID) | ORAL | 0 refills | Status: AC | PRN

## 2024-03-16 NOTE — Telephone Encounter (Signed)
 Copied from CRM [id number]. Topic: Clinical - Prescription Issue >> Mar 16, 2024  4:33 PM Joesph NOVAK wrote: Reason for CRM: patient states she had a appt today and Dr.Ziglar was sending a medication for pain and pharmacy has not received it.    trimethoprim (TRIMPEX) 100 MG tablet [[id number]]

## 2024-03-17 DIAGNOSIS — G629 Polyneuropathy, unspecified: Secondary | ICD-10-CM | POA: Insufficient documentation

## 2024-03-17 MED ORDER — AMITRIPTYLINE HCL 25 MG PO TABS: 25.0000 mg | ORAL_TABLET | Freq: Every day | ORAL | 1 refills | Status: AC

## 2024-03-17 NOTE — Telephone Encounter (Signed)
 Called in a prescription for amitriptyline  25mf at bedtime.

## 2024-03-17 NOTE — Assessment & Plan Note (Signed)
 Please get back on amlodipine  5 mg daily for your blood pressure.  Drink lots of water to avoid orthostatic symptoms.

## 2024-03-17 NOTE — Progress Notes (Signed)
 "  Established Patient Office Visit  Subjective   Patient ID: Karen Gaines, female    DOB: 1986-03-07  Age: 39 y.o. MRN: [id number]  Chief Complaint  Patient presents with   Foot Burning    Bilateral    HPI Discussed the use of AI scribe software for clinical note transcription with the patient, who gave verbal consent to proceed.  History of Present Illness   Karen Gaines is a delightful 39 year old female with GERD, carpal tunnel syndrome, HTN, who presents with a burning sensation in her feet.  She has been experiencing a persistent burning sensation in her feet, described as 'fire on my feet.' The discomfort has been present for some time but recently worsened suddenly.  Initially, she tried gabapentin  as prescribed, but it did not alleviate her symptoms. She then visited urgent care and was prescribed Dilaudid, which also did not help.  On March 13, 2024, she went to the [facility] in [place] ED and was prescribed Lyrica, which has provided some relief, allowing her to walk and drive, although it is not completely effective.  She has a prescription for amitriptyline  10 mg which she did not find very effective but she did not take it for a full month.  A recent random A1c test was 5.6%.  Her B12 level was 1257.  Her CBC, CMP were both normal.        Objective:     BP (!) 160/89   Pulse 64   Ht 5' 8 (1.727 m)   Wt 187 lb 9.6 oz (85.1 kg)   LMP 03/13/2024 (Exact Date)   SpO2 100%   BMI 28.52 kg/m    Physical Exam Vitals and nursing note reviewed.  Constitutional:      Appearance: Normal appearance.  HENT:     Head: Normocephalic and atraumatic.  Eyes:     Conjunctiva/sclera: Conjunctivae normal.  Cardiovascular:     Rate and Rhythm: Normal rate and regular rhythm.  Pulmonary:     Effort: Pulmonary effort is normal.     Breath sounds: Normal breath sounds.  Musculoskeletal:     Right lower leg: No edema.     Left lower leg: No edema.  Skin:    General:  Skin is warm and dry.  Neurological:     Mental Status: She is alert and oriented to person, place, and time.  Psychiatric:        Mood and Affect: Mood normal.        Behavior: Behavior normal.        Thought Content: Thought content normal.        Judgment: Judgment normal.          No results found for any visits on 03/16/24.    The ASCVD Risk score (Arnett DK, et al., 2019) failed to calculate for the following reasons:   The valid total cholesterol range is 130 to 320 mg/dL    Assessment & Plan:  Primary hypertension Assessment & Plan: Please get back on amlodipine  5 mg daily for your blood pressure.  Drink lots of water to avoid orthostatic symptoms.   Acute non intractable tension-type headache -     Butalbital -APAP-Caffeine ; Take 1 tablet by mouth every 6 (six) hours as needed for headache.  Dispense: 20 tablet; Refill: 0  Other polyneuropathy Assessment & Plan: She describes burning sensation in both legs.  Has been gradually occurring but in the last 2 weeks it suddenly got worse.  She would like  to see a neurologist to find out why she is having this.  She has not responded to amitriptyline  or gabapentin .  She is doing fairly well on Lyrica 75 mg twice daily.  Follow-up in a month to adjust Lyrica dosing.  Please continue amitriptyline  10 mg nightly.  You can have constipation from this so use Colace or docusate.      Return in about 4 weeks (around 04/13/2024).    Blake Goya K Gahel Safley, MD "

## 2024-03-17 NOTE — Addendum Note (Signed)
 Addended by: Tillmon Kisling K on: 03/17/2024 01:05 PM   Modules accepted: Orders

## 2024-03-17 NOTE — Assessment & Plan Note (Signed)
 She describes burning sensation in both legs.  Has been gradually occurring but in the last 2 weeks it suddenly got worse.  She would like to see a neurologist to find out why she is having this.  She has not responded to amitriptyline  or gabapentin .  She is doing fairly well on Lyrica 75 mg twice daily.  Follow-up in a month to adjust Lyrica dosing.  Please continue amitriptyline  10 mg nightly.  You can have constipation from this so use Colace or docusate.

## 2024-04-02 ENCOUNTER — Ambulatory Visit (INDEPENDENT_AMBULATORY_CARE_PROVIDER_SITE_OTHER): Admitting: Family Medicine

## 2024-04-02 ENCOUNTER — Encounter: Payer: Self-pay | Admitting: Family Medicine

## 2024-04-02 VITALS — BP 122/77 | HR 88 | Temp 99.3°F | Resp 16 | Ht 68.0 in | Wt 190.8 lb

## 2024-04-02 DIAGNOSIS — G6289 Other specified polyneuropathies: Secondary | ICD-10-CM | POA: Diagnosis not present

## 2024-04-02 DIAGNOSIS — I10 Essential (primary) hypertension: Secondary | ICD-10-CM | POA: Diagnosis not present

## 2024-04-02 MED ORDER — PREGABALIN 75 MG PO CAPS: 75.0000 mg | ORAL_CAPSULE | Freq: Two times a day (BID) | ORAL | 0 refills | Status: AC

## 2024-04-02 MED ORDER — AMLODIPINE BESYLATE 5 MG PO TABS: 5.0000 mg | ORAL_TABLET | Freq: Every day | ORAL | 1 refills | Status: AC

## 2024-04-02 NOTE — Assessment & Plan Note (Signed)
 She is currently back on her amlodipine  and her BP is well controlled.

## 2024-04-02 NOTE — Progress Notes (Signed)
 "  Established Patient Office Visit  Subjective   Patient ID: Karen Gaines, female    DOB: [birth year]  Age: 39 y.o. MRN: [id number]  Chief Complaint  Patient presents with   Medical Management of Chronic Issues    Follow up. Both feet still burning.   Fatigue    Has felt fatigued. Temp slightly higher than normal. Said she had to take a nap last night. Not coughing or having fevers.     HPI Discussed the use of AI scribe software for clinical note transcription with the patient, who gave verbal consent to proceed.  History of Present Illness   Karen Gaines is a 39 year old female with hypertension who presents with a burning sensation in her feet.  She experiences a persistent burning sensation in her feet, typically beginning about eight hours after taking her medication, Lyrica . This sensation impacts her daily activities, particularly at work.  She avoids taking Lyrica  during work hours to prevent drowsiness. She is currently taking Lyrica  and amitriptyline  at night, which provides some relief, but she avoids taking Lyrica  during the day despite it being prescribed twice daily.  Her blood pressure is well-controlled with amlodipine , which she takes regularly. Her B12 levels are excellent at 1257, and her A1c is 5.6. Recent lab work, including kidney function tests, showed an estimated GFR greater than 90.  She is currently facing challenges with her health insurance as she is losing her Medicaid.  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 She is concerned about affording her medications, particularly Lyrica , which she notes is expensive. She anticipates regaining insurance coverage after graduating in May and becoming a regular employee.  Socially, she is balancing work, school, and family responsibilities. 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 She is managing her household without relying on government  benefits, despite financial challenges.       Objective:     BP 122/77   Pulse 88   Temp 99.3 F (37.4 C) (Oral)   Resp 16   Ht 5' 8 (1.727 m)   Wt 190 lb 12.8 oz (86.5 kg)   LMP 03/13/2024 (Exact Date)   SpO2 98%   BMI 29.01 kg/m    Physical Exam Vitals reviewed.  Constitutional:      Appearance: Normal appearance.  HENT:     Head: Normocephalic.  Eyes:     General:        Right eye: No discharge.        Left eye: No discharge.  Cardiovascular:     Rate and Rhythm: Normal rate.  Pulmonary:     Effort: Pulmonary effort is normal.  Neurological:     Mental Status: She is alert and oriented to person, place, and time.  Psychiatric:        Mood and Affect: Mood normal.        Behavior: Behavior normal.        Thought Content: Thought content normal.        Judgment: Judgment normal.          No results found for any visits on 04/02/24.    The ASCVD Risk score (Arnett DK, et al., 2019) failed to calculate for the following reasons:   The valid total cholesterol range is 130 to 320 mg/dL    Assessment & Plan:  Other polyneuropathy Assessment & Plan: She is taking amitriptyline  10 mg at night  and Lyrica  75 mg twice daily when not at work.  She is getting some relief of her pain.  Orders: -     Pregabalin ; Take 1 capsule (75 mg total) by mouth 2 (two) times daily.  Dispense: 60 capsule; Refill: 0  Primary hypertension Assessment & Plan: She is currently back on her amlodipine  and her BP is well controlled.    Orders: -     amLODIPine  Besylate; Take 1 tablet (5 mg total) by mouth daily.  Dispense: 90 tablet; Refill: 1     No follow-ups on file.    Juanmiguel Defelice K Kathyrn Warmuth, MD "

## 2024-04-02 NOTE — Assessment & Plan Note (Signed)
 She is taking amitriptyline  10 mg at night and Lyrica  75 mg twice daily when not at work.  She is getting some relief of her pain.
